# Patient Record
Sex: Female | Born: 1975 | Race: White | Hispanic: No | Marital: Married | Smoking: Never smoker
Health system: Southern US, Community
[De-identification: ages and names within clinical notes are randomized; demographics above are authoritative.]

## PROBLEM LIST (undated history)

## (undated) DIAGNOSIS — E282 Polycystic ovarian syndrome: Secondary | ICD-10-CM

## (undated) DIAGNOSIS — K589 Irritable bowel syndrome without diarrhea: Secondary | ICD-10-CM

## (undated) DIAGNOSIS — I10 Essential (primary) hypertension: Secondary | ICD-10-CM

## (undated) HISTORY — DX: Polycystic ovarian syndrome: E28.2

---

## 1997-11-19 ENCOUNTER — Emergency Department (HOSPITAL_COMMUNITY): Admission: EM | Admit: 1997-11-19 | Discharge: 1997-11-19 | Payer: Self-pay | Admitting: Emergency Medicine

## 1998-12-09 ENCOUNTER — Other Ambulatory Visit: Admission: RE | Admit: 1998-12-09 | Discharge: 1998-12-09 | Payer: Self-pay | Admitting: Internal Medicine

## 1999-12-15 ENCOUNTER — Other Ambulatory Visit: Admission: RE | Admit: 1999-12-15 | Discharge: 1999-12-15 | Payer: Self-pay | Admitting: Internal Medicine

## 2000-12-09 ENCOUNTER — Other Ambulatory Visit: Admission: RE | Admit: 2000-12-09 | Discharge: 2000-12-09 | Payer: Self-pay | Admitting: Internal Medicine

## 2001-12-30 ENCOUNTER — Other Ambulatory Visit: Admission: RE | Admit: 2001-12-30 | Discharge: 2001-12-30 | Payer: Self-pay | Admitting: Internal Medicine

## 2003-01-05 ENCOUNTER — Other Ambulatory Visit: Admission: RE | Admit: 2003-01-05 | Discharge: 2003-01-05 | Payer: Self-pay | Admitting: Internal Medicine

## 2004-01-11 ENCOUNTER — Other Ambulatory Visit: Admission: RE | Admit: 2004-01-11 | Discharge: 2004-01-11 | Payer: Self-pay | Admitting: Internal Medicine

## 2004-06-11 ENCOUNTER — Encounter: Admission: RE | Admit: 2004-06-11 | Discharge: 2004-06-11 | Payer: Self-pay | Admitting: Internal Medicine

## 2005-01-12 ENCOUNTER — Other Ambulatory Visit: Admission: RE | Admit: 2005-01-12 | Discharge: 2005-01-12 | Payer: Self-pay | Admitting: Internal Medicine

## 2007-01-13 ENCOUNTER — Inpatient Hospital Stay (HOSPITAL_COMMUNITY): Admission: AD | Admit: 2007-01-13 | Discharge: 2007-01-13 | Payer: Self-pay | Admitting: Obstetrics and Gynecology

## 2007-02-10 ENCOUNTER — Inpatient Hospital Stay (HOSPITAL_COMMUNITY): Admission: AD | Admit: 2007-02-10 | Discharge: 2007-02-14 | Payer: Self-pay | Admitting: Obstetrics and Gynecology

## 2007-02-11 ENCOUNTER — Encounter (INDEPENDENT_AMBULATORY_CARE_PROVIDER_SITE_OTHER): Payer: Self-pay | Admitting: Obstetrics and Gynecology

## 2008-03-26 ENCOUNTER — Telehealth: Payer: Self-pay | Admitting: Family Medicine

## 2008-03-26 ENCOUNTER — Other Ambulatory Visit: Admission: RE | Admit: 2008-03-26 | Discharge: 2008-03-26 | Payer: Self-pay | Admitting: Family Medicine

## 2008-03-26 ENCOUNTER — Ambulatory Visit: Payer: Self-pay | Admitting: Family Medicine

## 2008-03-26 ENCOUNTER — Encounter: Payer: Self-pay | Admitting: Family Medicine

## 2008-03-26 DIAGNOSIS — E282 Polycystic ovarian syndrome: Secondary | ICD-10-CM | POA: Insufficient documentation

## 2008-03-26 DIAGNOSIS — K589 Irritable bowel syndrome without diarrhea: Secondary | ICD-10-CM | POA: Insufficient documentation

## 2008-03-26 DIAGNOSIS — E785 Hyperlipidemia, unspecified: Secondary | ICD-10-CM | POA: Insufficient documentation

## 2008-03-26 DIAGNOSIS — G43009 Migraine without aura, not intractable, without status migrainosus: Secondary | ICD-10-CM | POA: Insufficient documentation

## 2008-03-27 ENCOUNTER — Telehealth: Payer: Self-pay | Admitting: Family Medicine

## 2008-03-27 ENCOUNTER — Encounter: Payer: Self-pay | Admitting: Family Medicine

## 2008-03-27 LAB — CONVERTED CEMR LAB
AST: 26 units/L (ref 0–37)
Alkaline Phosphatase: 53 units/L (ref 39–117)
BUN: 8 mg/dL (ref 6–23)
Glucose, Bld: 86 mg/dL (ref 70–99)
HDL: 50 mg/dL (ref >39)
LDL Cholesterol: 142 mg/dL — ABNORMAL HIGH (ref 0–99)
Potassium: 4 meq/L (ref 3.5–5.3)
Sodium: 139 meq/L (ref 135–145)
TSH: 1.81 microintl units/mL (ref 0.350–4.50)
Total Bilirubin: 0.5 mg/dL (ref 0.3–1.2)
Total Protein: 7 g/dL (ref 6.0–8.3)
Triglycerides: 286 mg/dL — ABNORMAL HIGH (ref <150)
VLDL: 57 mg/dL — ABNORMAL HIGH (ref 0–40)

## 2008-03-29 ENCOUNTER — Ambulatory Visit: Payer: Self-pay | Admitting: Family Medicine

## 2008-03-29 DIAGNOSIS — I10 Essential (primary) hypertension: Secondary | ICD-10-CM

## 2008-07-02 ENCOUNTER — Encounter: Payer: Self-pay | Admitting: Family Medicine

## 2008-07-03 LAB — CONVERTED CEMR LAB
HDL: 60 mg/dL (ref >39)
LDL Cholesterol: 119 mg/dL — ABNORMAL HIGH (ref 0–99)
Total CHOL/HDL Ratio: 4.3

## 2008-09-28 ENCOUNTER — Telehealth (INDEPENDENT_AMBULATORY_CARE_PROVIDER_SITE_OTHER): Payer: Self-pay | Admitting: *Deleted

## 2009-03-19 ENCOUNTER — Telehealth: Payer: Self-pay | Admitting: Family Medicine

## 2009-03-21 ENCOUNTER — Encounter: Payer: Self-pay | Admitting: Family Medicine

## 2009-03-25 LAB — CONVERTED CEMR LAB
ALT: 36 units/L — ABNORMAL HIGH (ref 0–35)
AST: 32 units/L (ref 0–37)
Albumin: 4.3 g/dL (ref 3.5–5.2)
Alkaline Phosphatase: 53 units/L (ref 39–117)
LDL Cholesterol: 164 mg/dL — ABNORMAL HIGH (ref 0–99)
Potassium: 4.3 meq/L (ref 3.5–5.3)
Sodium: 140 meq/L (ref 135–145)
Total Bilirubin: 0.6 mg/dL (ref 0.3–1.2)
Total Protein: 7 g/dL (ref 6.0–8.3)
Triglycerides: 333 mg/dL — ABNORMAL HIGH (ref <150)
VLDL: 67 mg/dL — ABNORMAL HIGH (ref 0–40)

## 2009-03-26 ENCOUNTER — Ambulatory Visit: Payer: Self-pay | Admitting: Family Medicine

## 2009-03-26 ENCOUNTER — Other Ambulatory Visit: Admission: RE | Admit: 2009-03-26 | Discharge: 2009-03-26 | Payer: Self-pay | Admitting: Family Medicine

## 2009-03-26 ENCOUNTER — Encounter: Payer: Self-pay | Admitting: Family Medicine

## 2009-03-28 LAB — CONVERTED CEMR LAB: Pap Smear: NORMAL

## 2009-04-25 ENCOUNTER — Ambulatory Visit: Payer: Self-pay | Admitting: Family Medicine

## 2009-05-07 ENCOUNTER — Ambulatory Visit: Payer: Self-pay | Admitting: Family Medicine

## 2009-05-08 ENCOUNTER — Encounter: Payer: Self-pay | Admitting: Family Medicine

## 2009-05-08 LAB — CONVERTED CEMR LAB
Chloride: 101 meq/L (ref 96–112)
Creatinine, Ser: 0.61 mg/dL (ref 0.40–1.20)
Potassium: 3.9 meq/L (ref 3.5–5.3)
Sodium: 137 meq/L (ref 135–145)

## 2009-06-28 ENCOUNTER — Ambulatory Visit: Payer: Self-pay | Admitting: Family Medicine

## 2009-06-28 DIAGNOSIS — J019 Acute sinusitis, unspecified: Secondary | ICD-10-CM

## 2009-11-18 ENCOUNTER — Ambulatory Visit: Payer: Self-pay | Admitting: Family Medicine

## 2010-06-24 NOTE — Assessment & Plan Note (Signed)
Summary: f/u HTN   Vital Signs:  Patient profile:   35 year old female Height:      60 inches Weight:      188 pounds BMI:     36.85 O2 Sat:      98 % on Room air Pulse rate:   90 / minute BP sitting:   129 / 79  (left arm) Cuff size:   large  Vitals Entered By: Payton Spark CMA (November 18, 2009 1:03 PM)  O2 Flow:  Room air CC: F/U HTN, Hypertension Management   CC:  F/U HTN and Hypertension Management.  Hypertension History:      She denies headache, chest pain, palpitations, dyspnea with exertion, orthopnea, PND, peripheral edema, visual symptoms, neurologic problems, syncope, and side effects from treatment.  She notes no problems with any antihypertensive medication side effects.        Positive major cardiovascular risk factors include hyperlipidemia and hypertension.  Negative major cardiovascular risk factors include female age less than 49 years old, no history of diabetes, negative family history for ischemic heart disease, and non-tobacco-user status.        Further assessment for target organ damage reveals no history of ASHD, cardiac end-organ damage (CHF/LVH), stroke/TIA, peripheral vascular disease, renal insufficiency, or hypertensive retinopathy.     Current Medications (verified): 1)  Daily Multiple Vitamins  Tabs (Multiple Vitamin) .... Tak Eone Tablet By Mouth Oncea Day 2)  Sumatriptan Succinate 100 Mg Tabs (Sumatriptan Succinate) .... One By Mouth As Needed By Migraines. 3)  Lisinopril-Hydrochlorothiazide 10-12.5 Mg Tabs (Lisinopril-Hydrochlorothiazide) .... Take 1 Tablet By Mouth Once A Day 4)  Jolessa 0.15-0.03 Mg Tabs (Levonorgest-Eth Estrad 91-Day) .... Take 1 Tablet By Mouth Once A Day 5)  Proair Hfa 108 (90 Base) Mcg/act Aers (Albuterol Sulfate) .... 2- 4 Puffs Inhaled Every 4-6 Hours As Needed 6)  Tramadol Hcl 50 Mg Tabs (Tramadol Hcl) .... Take 1 Tablet By Mouth Three Times A Day As Needed Severe Pain.  Allergies (verified): No Known Drug  Allergies   Impression & Recommendations:  Problem # 1:  HYPERTENSION, BENIGN (ICD-401.1) BP is at goal.  RFd meds.  She is to stay on OCPs while on this until she sees her OB.  Labs UTD.  CPE due in Nov. Her updated medication list for this problem includes:    Lisinopril-hydrochlorothiazide 10-12.5 Mg Tabs (Lisinopril-hydrochlorothiazide) .Marland Kitchen... Take 1 tablet by mouth once a day  BP today: 129/79 Prior BP: 124/82 (06/28/2009)  Prior 10 Yr Risk Heart Disease: 1 % (03/27/2008)  Labs Reviewed: K+: 3.9 (05/08/2009) Creat: : 0.61 (05/08/2009)   Chol: 284 (03/21/2009)   HDL: 53 (03/21/2009)   LDL: 164 (03/21/2009)   TG: 333 (03/21/2009)  Complete Medication List: 1)  Daily Multiple Vitamins Tabs (Multiple vitamin) .... Tak eone tablet by mouth oncea day 2)  Sumatriptan Succinate 100 Mg Tabs (Sumatriptan succinate) .... One by mouth as needed by migraines. 3)  Lisinopril-hydrochlorothiazide 10-12.5 Mg Tabs (Lisinopril-hydrochlorothiazide) .... Take 1 tablet by mouth once a day 4)  Jolessa 0.15-0.03 Mg Tabs (Levonorgest-eth estrad 91-day) .... Take 1 tablet by mouth once a day 5)  Proair Hfa 108 (90 Base) Mcg/act Aers (Albuterol sulfate) .... 2- 4 puffs inhaled every 4-6 hours as needed 6)  Tramadol Hcl 50 Mg Tabs (Tramadol hcl) .... Take 1 tablet by mouth three times a day as needed severe pain.  Hypertension Assessment/Plan:      The patient's hypertensive risk group is category B: At least one risk factor (  excluding diabetes) with no target organ damage.  Her calculated 10 year risk of coronary heart disease is 1 %.  Today's blood pressure is 129/79.  Her blood pressure goal is < 140/90.  Patient Instructions: 1)  Stay on current meds for now. 2)  CPE due after 11-2. 3)  Meds RFd Prescriptions: LISINOPRIL-HYDROCHLOROTHIAZIDE 10-12.5 MG TABS (LISINOPRIL-HYDROCHLOROTHIAZIDE) Take 1 tablet by mouth once a day  #90 x 1   Entered and Authorized by:   Seymour Bars DO   Signed by:   Seymour Bars DO on 11/18/2009   Method used:   Electronically to        [facility]* (retail)             ,          Ph: [phone]       Fax: [phone]   RxID:   [id number]

## 2010-06-24 NOTE — Assessment & Plan Note (Signed)
Summary: Acute sinus   Vital Signs:  Patient profile:   35 year old female Height:      60 inches Weight:      186 pounds BMI:     36.46 O2 Sat:      99 % Temp:     98.4 degrees F oral Pulse rate:   87 / minute BP sitting:   124 / 82  (right arm)  Vitals Entered By: Charolett Bumpers (June 28, 2009 1:49 PM)  Primary Care Provider:  Nani Gasser, MD   History of Present Illness: [name] afternoon started with severe HA, then pain radiates into her left jaw. No ear pain.  Nasal congestion. took sudafed - some relief.  Alsoon her period. No fever. Using icepack.  Taking excedrin for the HA. Took an imitrex last night as well. No ST. Had a URI last week, started in her chest and then went to her nose. Did get better for a few days.  Did vomit once last night.    Allergies: No Known Drug Allergies  Physical Exam  General:  Well-developed,well-nourished,in no acute distress; alert,appropriate and cooperative throughout examination Head:  Normocephalic and atraumatic without obvious abnormalities. No apparent alopecia or balding. Eyes:  No corneal or conjunctival inflammation noted. EOMI. Perrla.  Ears:  External ear exam shows no significant lesions or deformities.  Otoscopic examination reveals clear canals, tympanic membranes are intact bilaterally without bulging, retraction, inflammation or discharge. Hearing is grossly normal bilaterally. Nose:  External nasal examination shows no deformity or inflammation. Nasal mucosa are pink and moist without lesions or exudates. Mouth:  Oral mucosa and oropharynx without lesions or exudates.  Teeth in good repair. Neck:  No deformities, masses, or tenderness noted. Lungs:  Normal respiratory effort, chest expands symmetrically. Lungs are clear to auscultation, no crackles or wheezes. Heart:  Normal rate and regular rhythm. S1 and S2 normal without gallop, murmur, click, rub or other extra sounds. Skin:  no rashes.   Cervical Nodes:   No lymphadenopathy noted Psych:  Cognition and judgment appear intact. Alert and cooperative with normal attention span and concentration. No apparent delusions, illusions, hallucinations   Impression & Recommendations:  Problem # 1:  SINUSITIS, ACUTE (ICD-461.9)  Likely secondary bacterial infection after a viral infection last week.  Her updated medication list for this problem includes:    Amoxicillin 500 Mg Cap (Amoxicillin) .Marland Kitchen... Take 1 capsule by mouth three times a day x 10 days  Instructed on treatment. Call if symptoms persist or worsen.   Complete Medication List: 1)  Daily Multiple Vitamins Tabs (Multiple vitamin) .... Tak eone tablet by mouth oncea day 2)  Sumatriptan Succinate 100 Mg Tabs (Sumatriptan succinate) .... One by mouth as needed by migraines. 3)  Lisinopril-hydrochlorothiazide 10-12.5 Mg Tabs (Lisinopril-hydrochlorothiazide) .... Take 1 tablet by mouth once a day 4)  Jolessa 0.15-0.03 Mg Tabs (Levonorgest-eth estrad 91-day) .... Take 1 tablet by mouth once a day 5)  Proair Hfa 108 (90 Base) Mcg/act Aers (Albuterol sulfate) .... 2- 4 puffs inhaled every 4-6 hours as needed 6)  Amoxicillin 500 Mg Cap (Amoxicillin) .... Take 1 capsule by mouth three times a day x 10 days 7)  Tramadol Hcl 50 Mg Tabs (Tramadol hcl) .... Take 1 tablet by mouth three times a day as needed severe pain.  Prescriptions: TRAMADOL HCL 50 MG TABS (TRAMADOL HCL) Take 1 tablet by mouth three times a day as needed severe pain.  #30 x 0   Entered and Authorized by:  Nani Gasser MD   Signed by:   Nani Gasser MD on 06/28/2009   Method used:   Electronically to        [facility] [phone]* (retail)       [address] [place], [place]  [address]       Ph: [phone] or [phone]       Fax: [phone]   RxID:   [id number] AMOXICILLIN 500 MG CAP (AMOXICILLIN) Take 1 capsule by mouth three times a day X 10 days  #30 x 0   Entered and Authorized by:   Nani Gasser MD   Signed by:   Nani Gasser MD on 06/28/2009   Method used:   Electronically to        [facility] [phone]* (retail)       [address]       [place], [place]  [address]       Ph: [phone] or [phone]       Fax: [phone]   RxID:   [id number]

## 2010-07-01 LAB — ABO/RH: RH Type: POSITIVE

## 2010-07-01 LAB — ANTIBODY SCREEN: Antibody Screen: NEGATIVE

## 2010-07-01 LAB — RPR
RPR: NONREACTIVE
RPR: NONREACTIVE

## 2010-07-01 LAB — HEPATITIS B SURFACE ANTIGEN: Hepatitis B Surface Ag: NEGATIVE

## 2010-10-07 NOTE — Op Note (Signed)
NAMESANDEEP, [name] NO.:  [id number]   MEDICAL RECORD NO.:  [id number]          PATIENT TYPE:  INP   LOCATION:  9144                          FACILITY:  [facility]   PHYSICIAN:  Juluis Mire, M.D.   DATE OF BIRTH:  [birth year]   DATE OF PROCEDURE:  02/10/2007  DATE OF DISCHARGE:                               OPERATIVE REPORT   PREOPERATIVE DIAGNOSIS:  Intrauterine pregnancy at term with failure to  progress.   POSTOPERATIVE DIAGNOSIS:  Intrauterine pregnancy at term with failure to  progress.   OPERATIVE PROCEDURE:  Low transverse cesarean section.   SURGEON:  Juluis Mire, M.D.   ANESTHESIA:  Was epidural.   ESTIMATED BLOOD LOSS:  Was 500-600 mL.   PACKS AND DRAINS:  None.   INTRODUCTION:  None.   COMPLICATIONS:  None.   INDICATIONS:  As follows:  A 35 year old primigravid female presented  with spontaneous rupture of membranes.  Progressed to approximately 8 cm  distal dilatation, had a complete arrest of dilatation despite Pitocin  and adequate uterine activity over a period of 3-4 hours.  The decision  was to proceed with primary cesarean section for failure to progress.  Risks were explained including the risk of infection.  The risk of  hemorrhage that could require transfusion; risk of AIDS or hepatitis;  risk of injury to adjacent organs including bladder, bowel or ureters  that could require further exploratory surgery.  Risk of deep venous  thrombosis and pulmonary emboli.   PROCEDURE:  As follows:  The patient was taken to OR and placed in  supine position with left lateral tilt.  After satisfactory level of  epidural anesthesia was obtained, the abdomen was prepped out with  Betadine and draped as a sterile field.  A low transverse skin incision  was made with a knife and carried through subcutaneous tissue.  Fascia  was entered sharply and incision in the fascia extended laterally.  Fascia was taken off the muscle superiorly and  inferiorly.  Rectus  muscles were separated in the midline.  Peritoneum was entered sharply,  and incision in the peritoneum was extended both superiorly and  inferiorly.  Low transverse bladder flap was developed.  Low transverse  uterine incision begun with a knife and extended laterally using manual  traction.  Amniotic fluid was clear.  The infant presented in the vertex  presentation and delivered with the elevation of the head and fundal  pressure.  The infant was a viable female who weighed 7 pounds 11 ounces.  Apgars were 8/9.  Umbilical artery pH was pending at this time.  Placenta was then delivered manually and sent to pathology.  Uterus was  exteriorized for closure.  Uterus closed with running locking suture of  0 chromic using a two-layer closure technique.  Areas of continued  bleeding were brought under control with figure-of-eights of 0 chromic.  Tubes and ovaries were completely normal.  Uterus was returned the  abdominal cavity.  We irrigated the pelvis and had no active bleeding.  Urine output was initially blood tinged when she came  into the OR.  It  was clearing at this point in time.  At this point in time, muscle and  peritoneum closed with a running suture of 3-0 Vicryl.  Fascia closed  with a running  suture of 0 PDS.  Skin was closed with staples and Steri-Strips.  Sponge, instrument and needle count was reported as correct by the  circulating nurse x2.  Foley catheter again was clearing at the time of  closure.  The patient tolerated the procedure well and was returned to  the recovery room in good condition.      Juluis Mire, M.D.  Electronically Signed     JSM/MEDQ  D:  02/11/2007  T:  02/11/2007  Job:  [id number]

## 2010-10-07 NOTE — Discharge Summary (Signed)
[name], [name]                 ACCOUNT NO.:  [id number]   MEDICAL RECORD NO.:  [id number]          PATIENT TYPE:  INP   LOCATION:  9144                          FACILITY:  [facility]   PHYSICIAN:  Dineen Kid. Rana Snare, M.D.    DATE OF BIRTH:  1975-10-16   DATE OF ADMISSION:  02/10/2007  DATE OF DISCHARGE:  02/14/2007                               DISCHARGE SUMMARY   ADMITTING DIAGNOSES:  1. Intrauterine pregnancy at 37 weeks estimated gestational age.  2. Spontaneous rupture of membranes.   DISCHARGE DIAGNOSES:  1. Status post low transverse cesarean section secondary to failure to      progress.  2. Viable female infant.   PROCEDURE:  Primary low transverse cesarean section.   REASON FOR ADMISSION:  Please see written H&P.   HOSPITAL COURSE:  The patient is a 35 year old primigravida that was  admitted to [facility] at 37 weeks estimated  gestational age with spontaneous rupture of membranes.  Pregnancy had  been otherwise uncomplicated.  On admission, vital signs were stable.  Blood pressure was noted to be slightly elevated.  Deep tendon reflexes  were 3+.  PIH labs were drawn which were within normal limits.  Fetal  heart tones were reactive.  The patient did progress to 6 cm dilated,  completely effaced, with vertex at a -1 station.  She did develop a  maternal temperature.  IV antibiotics were started.  After approximately  2 hours the patient had made essentially no further change in cervix and  decision was made to proceed with a primary low transverse cesarean  section.  The patient was then transferred to the operating room where  spinal anesthesia was administered.  The patient did deliver a viable  female infant weighing 7 pounds 11 ounces with Apgars of 8 at one minute  and 9 at five minutes.  The patient tolerated the procedure well and was  taken to the recovery room in stable condition.  On postoperative day  #1, the patient was without complaint.  Vital  signs were stable.  She  was afebrile.  Abdomen was soft.  Abdominal dressing was noted to be  clean, dry and intact.  Laboratory findings revealed hemoglobin of 9.1.  On postoperative day #2, the patient was without complaint.  Vital signs  were stable.  She was afebrile.  Fundus firm and nontender.  Abdominal  dressing had been removed revealing an incision that was clean, dry and  intact.  Laboratory findings revealed hemoglobin stable of 8.6.  On  postoperative day #3 the patient was without complaint.  She denied any  dyspnea.  She did complain of a nonproductive cough.  Vital signs were  stable.  She was afebrile.  Heart rate was 90-105.  Abdomen soft.  Fundus firm and nontender.  Lungs were clear to auscultation.  Incision  was noted to be intact but there was a small amount of dampness noted at  the incisional site.  Staples were intact.  No seroma was noted or  ecchymosis.  The patient's blood type was noted to be A positive.  Discharge instructions were reviewed and the patient was later  discharged home.   CONDITION ON DISCHARGE:  Good.   DIET:  Regular as tolerated.   ACTIVITY:  No heavy lifting, no driving x2 weeks, no vaginal entry.   FOLLOWUP:  The patient is to follow up in the office in 2 days for  staple removal.  She is to call for temperature greater than 100  degrees, persistent nausea and vomiting, heavy vaginal bleeding, and/or  redness or drainage from the incisional site.   DISCHARGE MEDICATIONS:  1. Percocet 5/325 #30.  2. Motrin 600 mg every 6 hours p.r.n.  3. Tessalon Perles one p.o. every 8 hours p.r.n. cough.  4. Tandem one p.o. daily.      Julio Sicks, N.P.      Dineen Kid Rana Snare, M.D.  Electronically Signed    CC/MEDQ  D:  02/14/2007  T:  02/14/2007  Job:  [id number]

## 2011-01-24 ENCOUNTER — Inpatient Hospital Stay (HOSPITAL_COMMUNITY): Admission: AD | Admit: 2011-01-24 | Payer: 59 | Source: Ambulatory Visit | Admitting: Obstetrics and Gynecology

## 2011-01-29 ENCOUNTER — Encounter (HOSPITAL_COMMUNITY)
Admission: RE | Admit: 2011-01-29 | Discharge: 2011-01-29 | Disposition: A | Discharge status: Home / Self Care | Payer: 59 | Source: Ambulatory Visit | Attending: Obstetrics and Gynecology | Admitting: Obstetrics and Gynecology

## 2011-01-29 ENCOUNTER — Encounter (HOSPITAL_COMMUNITY): Payer: Self-pay

## 2011-01-29 HISTORY — DX: Essential (primary) hypertension: I10

## 2011-01-29 HISTORY — DX: Irritable bowel syndrome, unspecified: K58.9

## 2011-01-29 LAB — CBC
HCT: 34.6 % — ABNORMAL LOW (ref 36.0–46.0)
RBC: 3.96 MIL/uL (ref 3.87–5.11)
RDW: 15.3 % (ref 11.5–15.5)
WBC: 7.6 10*3/uL (ref 4.0–10.5)

## 2011-01-29 LAB — SURGICAL PCR SCREEN
MRSA, PCR: NEGATIVE
Staphylococcus aureus: POSITIVE — AB

## 2011-01-29 NOTE — Patient Instructions (Addendum)
20 [name]  01/29/2011   Your procedure is scheduled on:  02/02/11  Enter through the Main Entrance of [facility] at 1130 AM.  Pick up the phone at the desk and dial [phone].   Call this number if you have problems the morning of surgery: [phone]   Remember:   Do not eat food:After Midnight.  Do not drink clear liquids: 4 Hours before arrival.  Take these medicines the morning of surgery with A SIP OF WATER: Blood pressure medication only.   Do not wear jewelry, make-up or nail polish.  Do not wear lotions, powders, or perfumes. You may wear deodorant.  Do not shave 48 hours prior to surgery.  Do not bring valuables to the hospital.  Contacts, dentures or bridgework may not be worn into surgery.  Leave suitcase in the car. After surgery it may be brought to your room.  For patients admitted to the hospital, checkout time is 11:00 AM the day of discharge.   Patients discharged the day of surgery will not be allowed to drive home.  Name and phone number of your driver: NA  Special Instructions: CHG Shower Use Special Wash: 1/2 bottle night before surgery and 1/2 bottle morning of surgery.   Please read over the following fact sheets that you were given: MRSA Information and Care and Recovery After Surgery

## 2011-02-02 ENCOUNTER — Encounter (HOSPITAL_COMMUNITY): Payer: Self-pay | Admitting: Anesthesiology

## 2011-02-02 ENCOUNTER — Inpatient Hospital Stay (HOSPITAL_COMMUNITY): Payer: 59 | Admitting: Anesthesiology

## 2011-02-02 ENCOUNTER — Inpatient Hospital Stay (HOSPITAL_COMMUNITY)
Admission: RE | Admit: 2011-02-02 | Discharge: 2011-02-05 | DRG: 765 | Disposition: A | Discharge status: Home / Self Care | Payer: 59 | Source: Ambulatory Visit | Attending: Obstetrics and Gynecology | Admitting: Obstetrics and Gynecology

## 2011-02-02 ENCOUNTER — Encounter (HOSPITAL_COMMUNITY)
Admission: RE | Disposition: A | Discharge status: Home / Self Care | Payer: Self-pay | Source: Ambulatory Visit | Attending: Obstetrics and Gynecology

## 2011-02-02 ENCOUNTER — Encounter (HOSPITAL_COMMUNITY): Payer: Self-pay | Admitting: *Deleted

## 2011-02-02 DIAGNOSIS — Z98891 History of uterine scar from previous surgery: Secondary | ICD-10-CM

## 2011-02-02 DIAGNOSIS — O34219 Maternal care for unspecified type scar from previous cesarean delivery: Principal | ICD-10-CM | POA: Diagnosis present

## 2011-02-02 DIAGNOSIS — O1002 Pre-existing essential hypertension complicating childbirth: Secondary | ICD-10-CM | POA: Diagnosis present

## 2011-02-02 SURGERY — Surgical Case
Anesthesia: Choice | Wound class: Clean Contaminated

## 2011-02-02 MED ORDER — OXYTOCIN 20 UNITS IN LACTATED RINGERS INFUSION - SIMPLE
INTRAVENOUS | Status: DC | PRN
  Administered 2011-02-02: 20 [IU] via INTRAVENOUS

## 2011-02-02 MED ORDER — LACTATED RINGERS IV SOLN
INTRAVENOUS | Status: DC
  Administered 2011-02-02: 12:00:00 via INTRAVENOUS

## 2011-02-02 MED ORDER — SODIUM CHLORIDE 0.9 % IV SOLN: 1.0000 ug/kg/h | INTRAVENOUS | Status: DC | PRN

## 2011-02-02 MED ORDER — SIMETHICONE 80 MG PO CHEW
80.0000 mg | CHEWABLE_TABLET | Freq: Three times a day (TID) | ORAL | Status: DC
  Administered 2011-02-02 – 2011-02-05 (×10): 80 mg via ORAL

## 2011-02-02 MED ORDER — SODIUM CHLORIDE 0.9 % IJ SOLN
3.0000 mL | INTRAMUSCULAR | Status: DC | PRN
  Administered 2011-02-03: 3 mL via INTRAVENOUS

## 2011-02-02 MED ORDER — ONDANSETRON HCL 4 MG/2ML IJ SOLN: 4.0000 mg | Freq: Three times a day (TID) | INTRAMUSCULAR | Status: DC | PRN

## 2011-02-02 MED ORDER — MUPIROCIN 2 % EX OINT
TOPICAL_OINTMENT | CUTANEOUS | Status: AC
  Administered 2011-02-02: 1 via NASAL
  Filled 2011-02-02: qty 22

## 2011-02-02 MED ORDER — KETOROLAC TROMETHAMINE 30 MG/ML IJ SOLN: 30.0000 mg | Freq: Four times a day (QID) | INTRAMUSCULAR | Status: AC | PRN

## 2011-02-02 MED ORDER — TETANUS-DIPHTH-ACELL PERTUSSIS 5-2.5-18.5 LF-MCG/0.5 IM SUSP: 0.5000 mL | Freq: Once | INTRAMUSCULAR | Status: DC

## 2011-02-02 MED ORDER — NALBUPHINE HCL 10 MG/ML IJ SOLN: 5.0000 mg | INTRAMUSCULAR | Status: DC | PRN

## 2011-02-02 MED ORDER — LACTATED RINGERS IV SOLN
INTRAVENOUS | Status: DC
  Administered 2011-02-02 – 2011-02-03 (×2): via INTRAVENOUS

## 2011-02-02 MED ORDER — ONDANSETRON HCL 4 MG/2ML IJ SOLN
INTRAMUSCULAR | Status: DC | PRN
  Administered 2011-02-02: 4 mg via INTRAVENOUS

## 2011-02-02 MED ORDER — NALOXONE HCL 0.4 MG/ML IJ SOLN: 0.4000 mg | INTRAMUSCULAR | Status: DC | PRN

## 2011-02-02 MED ORDER — BUPIVACAINE HCL (PF) 0.25 % IJ SOLN
INTRAMUSCULAR | Status: DC | PRN
  Administered 2011-02-02: 10 mL

## 2011-02-02 MED ORDER — ONDANSETRON HCL 4 MG/2ML IJ SOLN: 4.0000 mg | INTRAMUSCULAR | Status: DC | PRN

## 2011-02-02 MED ORDER — MEPERIDINE HCL 25 MG/ML IJ SOLN: 6.2500 mg | INTRAMUSCULAR | Status: DC | PRN

## 2011-02-02 MED ORDER — OXYTOCIN 20 UNITS IN LACTATED RINGERS INFUSION - SIMPLE
125.0000 mL/h | INTRAVENOUS | Status: AC
  Administered 2011-02-02: 125 mL/h via INTRAVENOUS

## 2011-02-02 MED ORDER — MENTHOL 3 MG MT LOZG: 1.0000 | LOZENGE | OROMUCOSAL | Status: DC | PRN

## 2011-02-02 MED ORDER — PRENATAL PLUS 27-1 MG PO TABS
1.0000 | ORAL_TABLET | Freq: Every day | ORAL | Status: DC
  Administered 2011-02-03 – 2011-02-05 (×3): 1 via ORAL
  Filled 2011-02-02 (×3): qty 1

## 2011-02-02 MED ORDER — IBUPROFEN 600 MG PO TABS
600.0000 mg | ORAL_TABLET | Freq: Four times a day (QID) | ORAL | Status: DC | PRN
  Filled 2011-02-02 (×3): qty 1

## 2011-02-02 MED ORDER — METOCLOPRAMIDE HCL 5 MG/ML IJ SOLN: 10.0000 mg | Freq: Three times a day (TID) | INTRAMUSCULAR | Status: DC | PRN

## 2011-02-02 MED ORDER — HYDROMORPHONE HCL 1 MG/ML IJ SOLN
INTRAMUSCULAR | Status: AC
  Administered 2011-02-02: 0.5 mg via INTRAVENOUS
  Filled 2011-02-02: qty 1

## 2011-02-02 MED ORDER — WITCH HAZEL-GLYCERIN EX PADS
1.0000 | MEDICATED_PAD | CUTANEOUS | Status: DC | PRN
Start: 2011-02-02 — End: 2011-02-05

## 2011-02-02 MED ORDER — KETOROLAC TROMETHAMINE 60 MG/2ML IM SOLN
60.0000 mg | Freq: Once | INTRAMUSCULAR | Status: AC | PRN
  Administered 2011-02-02: 60 mg via INTRAMUSCULAR

## 2011-02-02 MED ORDER — MEASLES, MUMPS & RUBELLA VAC ~~LOC~~ INJ: 0.5000 mL | INJECTION | Freq: Once | SUBCUTANEOUS | Status: DC

## 2011-02-02 MED ORDER — SCOPOLAMINE 1 MG/3DAYS TD PT72
1.0000 | MEDICATED_PATCH | Freq: Once | TRANSDERMAL | Status: DC
  Administered 2011-02-02: 1.5 mg via TRANSDERMAL

## 2011-02-02 MED ORDER — IBUPROFEN 600 MG PO TABS
600.0000 mg | ORAL_TABLET | Freq: Four times a day (QID) | ORAL | Status: DC
  Administered 2011-02-03 – 2011-02-05 (×9): 600 mg via ORAL
  Filled 2011-02-02 (×6): qty 1

## 2011-02-02 MED ORDER — PHENYLEPHRINE HCL 10 MG/ML IJ SOLN
INTRAMUSCULAR | Status: DC | PRN
  Administered 2011-02-02 (×6): 40 ug via INTRAVENOUS
  Administered 2011-02-02: 80 ug via INTRAVENOUS

## 2011-02-02 MED ORDER — OXYTOCIN 20 UNITS IN LACTATED RINGERS INFUSION - SIMPLE
INTRAVENOUS | Status: AC
  Administered 2011-02-02: 125 mL/h via INTRAVENOUS
  Filled 2011-02-02: qty 1000

## 2011-02-02 MED ORDER — SIMETHICONE 80 MG PO CHEW: 80.0000 mg | CHEWABLE_TABLET | ORAL | Status: DC | PRN

## 2011-02-02 MED ORDER — DIPHENHYDRAMINE HCL 25 MG PO CAPS: 25.0000 mg | ORAL_CAPSULE | ORAL | Status: DC | PRN

## 2011-02-02 MED ORDER — ONDANSETRON HCL 4 MG PO TABS: 4.0000 mg | ORAL_TABLET | ORAL | Status: DC | PRN

## 2011-02-02 MED ORDER — LABETALOL HCL 100 MG PO TABS
100.0000 mg | ORAL_TABLET | Freq: Two times a day (BID) | ORAL | Status: DC
  Administered 2011-02-03 – 2011-02-04 (×3): 100 mg via ORAL
  Filled 2011-02-02 (×7): qty 1

## 2011-02-02 MED ORDER — HYDROMORPHONE HCL 1 MG/ML IJ SOLN
0.2500 mg | INTRAMUSCULAR | Status: DC | PRN
  Administered 2011-02-02: 0.5 mg via INTRAVENOUS

## 2011-02-02 MED ORDER — MEDROXYPROGESTERONE ACETATE 150 MG/ML IM SUSP: 150.0000 mg | INTRAMUSCULAR | Status: DC | PRN

## 2011-02-02 MED ORDER — DIPHENHYDRAMINE HCL 25 MG PO CAPS: 25.0000 mg | ORAL_CAPSULE | Freq: Four times a day (QID) | ORAL | Status: DC | PRN

## 2011-02-02 MED ORDER — BUPIVACAINE IN DEXTROSE 0.75-8.25 % IT SOLN
INTRATHECAL | Status: DC | PRN
  Administered 2011-02-02: 1.5 mg via INTRATHECAL

## 2011-02-02 MED ORDER — LACTATED RINGERS IV SOLN
INTRAVENOUS | Status: DC
  Administered 2011-02-02: 13:00:00 via INTRAVENOUS

## 2011-02-02 MED ORDER — ZOLPIDEM TARTRATE 5 MG PO TABS: 5.0000 mg | ORAL_TABLET | Freq: Every evening | ORAL | Status: DC | PRN

## 2011-02-02 MED ORDER — KETOROLAC TROMETHAMINE 60 MG/2ML IM SOLN
INTRAMUSCULAR | Status: AC
  Administered 2011-02-02: 60 mg via INTRAMUSCULAR
  Filled 2011-02-02: qty 2

## 2011-02-02 MED ORDER — FENTANYL CITRATE 0.05 MG/ML IJ SOLN
INTRAMUSCULAR | Status: DC | PRN
  Administered 2011-02-02: 15 ug via INTRATHECAL

## 2011-02-02 MED ORDER — BISACODYL 10 MG RE SUPP: 10.0000 mg | Freq: Every day | RECTAL | Status: DC | PRN

## 2011-02-02 MED ORDER — LANOLIN HYDROUS EX OINT: 1.0000 "application " | TOPICAL_OINTMENT | CUTANEOUS | Status: DC | PRN

## 2011-02-02 MED ORDER — LACTATED RINGERS IV SOLN
INTRAVENOUS | Status: DC | PRN
  Administered 2011-02-02 (×2): via INTRAVENOUS

## 2011-02-02 MED ORDER — OXYCODONE-ACETAMINOPHEN 5-325 MG PO TABS
1.0000 | ORAL_TABLET | ORAL | Status: DC | PRN
  Administered 2011-02-03: 1 via ORAL
  Administered 2011-02-03: 2 via ORAL
  Filled 2011-02-02: qty 1
  Filled 2011-02-02: qty 2

## 2011-02-02 MED ORDER — DIPHENHYDRAMINE HCL 50 MG/ML IJ SOLN: 25.0000 mg | INTRAMUSCULAR | Status: DC | PRN

## 2011-02-02 MED ORDER — CEFAZOLIN SODIUM 1-5 GM-% IV SOLN
1.0000 g | INTRAVENOUS | Status: AC
  Administered 2011-02-02: 1 g via INTRAVENOUS

## 2011-02-02 MED ORDER — SENNOSIDES-DOCUSATE SODIUM 8.6-50 MG PO TABS
2.0000 | ORAL_TABLET | Freq: Every day | ORAL | Status: DC
  Administered 2011-02-02 – 2011-02-03 (×2): 2 via ORAL

## 2011-02-02 MED ORDER — MORPHINE SULFATE (PF) 0.5 MG/ML IJ SOLN
INTRAMUSCULAR | Status: DC | PRN
  Administered 2011-02-02: .1 mg via INTRATHECAL

## 2011-02-02 MED ORDER — METFORMIN HCL 500 MG PO TABS
500.0000 mg | ORAL_TABLET | Freq: Two times a day (BID) | ORAL | Status: DC
  Filled 2011-02-02 (×2): qty 1

## 2011-02-02 MED ORDER — DIPHENHYDRAMINE HCL 50 MG/ML IJ SOLN: 12.5000 mg | INTRAMUSCULAR | Status: DC | PRN

## 2011-02-02 MED ORDER — KETOROLAC TROMETHAMINE 30 MG/ML IJ SOLN
30.0000 mg | Freq: Four times a day (QID) | INTRAMUSCULAR | Status: AC | PRN
  Administered 2011-02-03: 30 mg via INTRAVENOUS
  Filled 2011-02-02: qty 1

## 2011-02-02 MED ORDER — FLEET ENEMA 7-19 GM/118ML RE ENEM: 1.0000 | ENEMA | RECTAL | Status: DC | PRN

## 2011-02-02 MED ORDER — DIBUCAINE 1 % RE OINT: 1.0000 "application " | TOPICAL_OINTMENT | RECTAL | Status: DC | PRN

## 2011-02-02 SURGICAL SUPPLY — 28 items
BARRIER ADHS 3X4 INTERCEED (GAUZE/BANDAGES/DRESSINGS) IMPLANT
CHLORAPREP W/TINT 26ML (MISCELLANEOUS) ×2 IMPLANT
CLOTH BEACON ORANGE TIMEOUT ST (SAFETY) ×2 IMPLANT
CONTAINER PREFILL 10% NBF 15ML (MISCELLANEOUS) IMPLANT
DRSG COVADERM 4X14 (GAUZE/BANDAGES/DRESSINGS) ×2 IMPLANT
ELECT REM PT RETURN 9FT ADLT (ELECTROSURGICAL) ×2
ELECTRODE REM PT RTRN 9FT ADLT (ELECTROSURGICAL) ×1 IMPLANT
EXTRACTOR VACUUM M CUP 4 TUBE (SUCTIONS) IMPLANT
GLOVE BIO SURGEON STRL SZ 6.5 (GLOVE) ×4 IMPLANT
GOWN PREVENTION PLUS LG XLONG (DISPOSABLE) ×6 IMPLANT
KIT ABG SYR 3ML LUER SLIP (SYRINGE) IMPLANT
NEEDLE HYPO 22GX1.5 SAFETY (NEEDLE) ×2 IMPLANT
NEEDLE HYPO 25X5/8 SAFETYGLIDE (NEEDLE) ×2 IMPLANT
NS IRRIG 1000ML POUR BTL (IV SOLUTION) ×2 IMPLANT
PACK C SECTION WH (CUSTOM PROCEDURE TRAY) ×2 IMPLANT
SEPRAFILM MEMBRANE 5X6 (MISCELLANEOUS) IMPLANT
SLEEVE SCD COMPRESS KNEE MED (MISCELLANEOUS) IMPLANT
STAPLER VISISTAT 35W (STAPLE) IMPLANT
SUT CHROMIC 0 CTX 36 (SUTURE) ×4 IMPLANT
SUT PLAIN 0 NONE (SUTURE) IMPLANT
SUT PLAIN 2 0 XLH (SUTURE) IMPLANT
SUT VIC AB 0 CT1 27 (SUTURE) ×3
SUT VIC AB 0 CT1 27XBRD ANBCTR (SUTURE) ×3 IMPLANT
SUT VIC AB 4-0 KS 27 (SUTURE) IMPLANT
SYR CONTROL 10ML LL (SYRINGE) ×2 IMPLANT
TOWEL OR 17X24 6PK STRL BLUE (TOWEL DISPOSABLE) ×4 IMPLANT
TRAY FOLEY CATH 14FR (SET/KITS/TRAYS/PACK) ×2 IMPLANT
WATER STERILE IRR 1000ML POUR (IV SOLUTION) ×2 IMPLANT

## 2011-02-02 NOTE — Progress Notes (Signed)
H and P on chart No changes. Proceed with Repeat LTCS

## 2011-02-02 NOTE — Transfer of Care (Signed)
Immediate Anesthesia Transfer of Care Note  Patient: [name]  Procedure(s) Performed:  CESAREAN SECTION  Patient Location: PACU  Anesthesia Type: Spinal  Level of Consciousness: awake, alert  and oriented  Airway & Oxygen Therapy: Patient Spontanous Breathing  Post-op Assessment: Report given to PACU RN and Post -op Vital signs reviewed and stable  Post vital signs: Reviewed and stable  Complications: No apparent anesthesia complications

## 2011-02-02 NOTE — Anesthesia Postprocedure Evaluation (Signed)
Anesthesia Post Note  Patient: [name]  Procedure(s) Performed:  CESAREAN SECTION  Anesthesia type: Spinal  Patient location: PACU  Post pain: Pain level controlled  Post assessment: Post-op Vital signs reviewed  Last Vitals:  Filed Vitals:   02/02/11 1415  BP: 127/75  Pulse: 84  Temp:   Resp: 16    Post vital signs: Reviewed  Level of consciousness: awake  Complications: No apparent anesthesia complications

## 2011-02-02 NOTE — Anesthesia Preprocedure Evaluation (Signed)
Anesthesia Evaluation  Name, MR# and DOB Patient awake  General Assessment Comment  Reviewed: Allergy & Precautions, NPO status , Patient's Chart, lab work & pertinent test results, reviewed documented beta blocker date and time   History of Anesthesia Complications Negative for: history of anesthetic complications  Airway Mallampati: II TM Distance: >3 FB Neck ROM: full    Dental  (+) Teeth Intact   Pulmonary  clear to auscultation  breath sounds clear to auscultation none    Cardiovascular hypertension (chronic), Pt. on home beta blockers regular Normal    Neuro/Psych   Headaches (remote h/o migraines)   Negative Neurological ROS  Negative Psych ROS  GI/Hepatic/Renal negative GI ROS  negative Liver ROS  negative Renal ROS        Endo/Other  (+)   Morbid obesityPCOS  Abdominal   Musculoskeletal   Hematology negative hematology ROS (+)   Peds  Reproductive/Obstetrics (+) Pregnancy (h/o c/s x1)    Anesthesia Other Findings             Anesthesia Physical Anesthesia Plan  ASA: III  Anesthesia Plan: Spinal   Post-op Pain Management:    Induction:   Airway Management Planned:   Additional Equipment:   Intra-op Plan:   Post-operative Plan:   Informed Consent: I have reviewed the patients History and Physical, chart, labs and discussed the procedure including the risks, benefits and alternatives for the proposed anesthesia with the patient or authorized representative who has indicated his/her understanding and acceptance.   Dental Advisory Given  Plan Discussed with: CRNA and Surgeon  Anesthesia Plan Comments:         Anesthesia Quick Evaluation

## 2011-02-02 NOTE — H&P (Signed)
35 year old G2 P1 at 60 weeks EDC 02/07/2011. She presents for Repeat C Section - Previous CSection x 1. History of Chronic Hypertension on Labetolol Prenatal care uncomplicated - see [place].  AF VSS Gen Alert and oriented Lung CTAB Car RRR Abd soft gravid Thayer Ohm 7 1/2 pound Ext no edema  Imp  IUP at 39 + weeks Chronic Hypertension Previous C Section x 1  Plan Repeat LTCS Risks discussed with patient

## 2011-02-02 NOTE — Consult Note (Signed)
Called to attend repeat C/S at term to a mother with chronic hypertension on medication and on Metformin for polycystic ovary disorder.  Membranes intact at time of surgery and there are no septic risk factors reported. At delivery infant in vertex and required vacuum assistance to expose head after which infant was fully delivered manually.  No dysmorphic features. Spontaneous cries and active movement of all extremities. Given tactile stim and bulb suction.    By observation fluid was clear and infant's transition was normal over 5 minutes. Apgar scores 9/9 at one and five minutes.   Dagoberto Ligas MD [facility]

## 2011-02-02 NOTE — Brief Op Note (Signed)
02/02/2011  2:03 PM  PATIENT:  [name]  35 y.o. female  PRE-OPERATIVE DIAGNOSIS:  Previous Cesarean Section   POST-OPERATIVE DIAGNOSIS:  Previous Cesarean Section   PROCEDURE:  Procedure(s): Repeat Low Transverse CESAREAN SECTION  SURGEON:  Surgeon(s): Jeani Hawking, MD  PHYSICIAN ASSISTANT:   ASSISTANTS: none   ANESTHESIA:   spinal  OR FLUID I/O:  Total I/O In: 2500 [I.V.:2500] Out: 800 [Urine:300; Blood:500]  BLOOD ADMINISTERED:none  DRAINS: Urinary Catheter (Foley)   LOCAL MEDICATIONS USED:  MARCAINE 10 CC  SPECIMEN:  No Specimen  DISPOSITION OF SPECIMEN:  N/A  COUNTS:  YES  TOURNIQUET:  * No tourniquets in log *  DICTATION: .Other Dictation: Dictation Number [id number]  PLAN OF CARE: Admit to inpatient   PATIENT DISPOSITION:  PACU - hemodynamically stable.   Delay start of Pharmacological VTE agent (>24hrs) due to surgical blood loss or risk of bleeding:  not applicable

## 2011-02-02 NOTE — Anesthesia Procedure Notes (Signed)
Spinal Block  Patient location during procedure: OR Start time: 02/02/2011 1:25 PM Staffing Performed by: anesthesiologist  Preanesthetic Checklist Completed: patient identified, site marked, surgical consent, pre-op evaluation, timeout performed, IV checked, risks and benefits discussed and monitors and equipment checked Spinal Block Patient position: sitting Prep: site prepped and draped and DuraPrep Patient monitoring: heart rate, cardiac monitor, continuous pulse ox and blood pressure Approach: midline Location: L3-4 Injection technique: single-shot Needle Needle type: Sprotte  Needle gauge: 24 G Needle length: 9 cm Assessment Sensory level: T4

## 2011-02-03 LAB — CBC
MCH: 28.8 pg (ref 26.0–34.0)
Platelets: 122 10*3/uL — ABNORMAL LOW (ref 150–400)
RBC: 3.2 MIL/uL — ABNORMAL LOW (ref 3.87–5.11)
RDW: 15.5 % (ref 11.5–15.5)
WBC: 7.4 10*3/uL (ref 4.0–10.5)

## 2011-02-03 MED FILL — Ephedrine Sulf-NaCl PF Pref Syr 50 MG/10ML-0.9% (5 MG/ML): INTRAVENOUS | Qty: 10 | Status: AC

## 2011-02-03 NOTE — Progress Notes (Signed)
Subjective: Postpartum Day 1: Cesarean Delivery Patient reports tolerating PO and no problems voiding.    Objective: Vital signs in last 24 hours: Temp:  [97.9 F (36.6 C)-99 F (37.2 C)] 98.3 F (36.8 C) (09/11 0630) Pulse Rate:  [69-90] 84  (09/11 0630) Resp:  [14-20] 18  (09/11 0630) BP: (90-137)/(52-80) 115/73 mmHg (09/11 0630) SpO2:  [95 %-99 %] 99 % (09/11 0630) Weight:  [88.451 kg (195 lb)] 195 lb (88.451 kg) (09/10 1144)  Physical Exam:  General: alert and cooperative Lochia: appropriate Uterine Fundus: firm Abd dressing with scant drainage  noted on bandage DVT Evaluation: No evidence of DVT seen on physical exam.   Basename 02/03/11 0540  HGB 9.2*  HCT 28.0*    Assessment/Plan: Status post Cesarean section. Doing well postoperatively.  Continue current care.  Kallee Nam G 02/03/2011, 9:00 AM

## 2011-02-03 NOTE — Anesthesia Postprocedure Evaluation (Signed)
  Anesthesia Post-op Note  Patient: [name]  Procedure(s) Performed:  CESAREAN SECTION  Patient Location: PACU and Mother/Baby  Anesthesia Type: Spinal  Level of Consciousness: awake, alert  and oriented  Airway and Oxygen Therapy: Patient Spontanous Breathing  Post-op Pain: none  Post-op Assessment: Post-op Vital signs reviewed and Patient's Cardiovascular Status Stable  Post-op Vital Signs: Reviewed and stable  Complications: No apparent anesthesia complications

## 2011-02-03 NOTE — Op Note (Signed)
[name], [name] NO.:  [id number]  MEDICAL RECORD NO.:  [id number]  LOCATION:  9133                          FACILITY:  [facility]  PHYSICIAN:  Minna Dumire L. Nature Vogelsang, M.D.DATE OF BIRTH:  June 24, 1975  DATE OF PROCEDURE:  02/02/2011 DATE OF DISCHARGE:                              OPERATIVE REPORT   PREOPERATIVE DIAGNOSES: 1. Intrauterine pregnancy at 60 and plus. 2. Previous cesarean section x1.  POSTOPERATIVE DIAGNOSES: 1. Intrauterine pregnancy at 43 and plus. 2. Previous cesarean section x1.  PROCEDURE:  Repeat low transverse cesarean section.  SURGEON:  Redmond Whittley L. Markea Ruzich, MD  ANESTHESIA:  Spinal.  ESTIMATED BLOOD LOSS:  Less than 500 mL.  COMPLICATIONS:  None.  PATHOLOGY:  None.  DESCRIPTION OF PROCEDURE:  The patient was taken to the operating room where spinal was placed.  She was then prepped and draped in usual sterile fashion.  A Foley catheter had been inserted.  A low transverse incision was made, carried down to the fascia.  The fascia was scored in the midline, extended laterally.  Rectus muscles were separated in the midline.  The peritoneum was entered bluntly.  The peritoneal incision was then stretched.  The bladder blade was reinserted.  The lower uterine segment was identified and the bladder flap was created sharply and then digitally.  A bladder blade was then readjusted.  A low transverse incision was made in the uterus.  The uterus was entered using the Hemostat.  The amniotic fluid was clear.  The baby was in cephalic presentation and was delivered easily with 1 gentle pull of the vacuum and no pop-offs.  The baby was the female infant.  Apgars 9 at 1 minute and 9 at 5 minutes.  The Neonatal Team was present for the delivery and the baby was taken to the Newborn Nursery.  After the placenta was manually removed, noted to be normal intact with a 3-vessel cord.  The uterus was cleared of all clots and debris.  Antibiotics and Pitocin  had been given.  The uterine incision was closed in 2 layers using 0 chromic and a running locked stitch.  The uterus was returned to the abdomen.  Irrigation was performed.  The peritoneum and rectus muscles were reapproximated using 0 Vicryl.  The fascia was closed using 0 Vicryl and running stitch x2, starting at each corner, meeting in the midline. After irrigation of subcutaneous layer, the skin was closed with staples.  All sponge, lap, and instrument counts were correct x2.  The patient went to recovery room in stable condition.     Ziya Coonrod L. Vincente Poli, M.D.     Florestine Avers  D:  02/02/2011  T:  02/02/2011  Job:  [id number]

## 2011-02-03 NOTE — Progress Notes (Signed)
Encounter addended by: Madison Hickman on: 02/03/2011  8:42 AM<BR>     Documentation filed: Notes Section

## 2011-02-04 NOTE — Progress Notes (Signed)
Subjective: Postpartum Daytwo: Cesarean Delivery Patient reports tolerating PO.    Objective: Vital signs in last 24 hours: Temp:  [97.7 F (36.5 C)-98.4 F (36.9 C)] 98.1 F (36.7 C) (09/12 0524) Pulse Rate:  [77-87] 77  (09/12 0524) Resp:  [18] 18  (09/12 0524) BP: (99-121)/(54-80) 114/76 mmHg (09/12 0524) SpO2:  [98 %] 98 % (09/11 1419)  Physical Exam:  General: alert Lochia: appropriate Uterine Fundus: firm Incision: healing well DVT Evaluation: No evidence of DVT seen on physical exam.   Basename 02/03/11 0540  HGB 9.2*  HCT 28.0*    Assessment/Plan: Status post Cesarean section. Doing well postoperatively.  Continue current care.  Danyeal Akens S 02/04/2011, 8:59 AM

## 2011-02-05 MED ORDER — OXYCODONE-ACETAMINOPHEN 5-325 MG PO TABS: 1.0000 | ORAL_TABLET | ORAL | Status: AC | PRN

## 2011-02-05 NOTE — Discharge Summary (Signed)
Obstetric Discharge Summary Reason for Admission: cesarean section Prenatal Procedures: none Intrapartum Procedures: cesarean: low cervical, transverse Postpartum Procedures: none Complications-Operative and Postpartum: none Hemoglobin  Date Value Range Status  02/03/2011 9.2* 12.0-15.0 (g/dL) Final     HCT  Date Value Range Status  02/03/2011 28.0* 36.0-46.0 (%) Final    Discharge Diagnoses: Term Pregnancy-delivered  Discharge Information: Date: 02/05/2011 Activity: pelvic rest Diet: routine Medications: PNV, Ibuprophen and Percocet Condition: stable Instructions: refer to practice specific booklet Discharge to: home Follow-up Information    Follow up in 4 days.         Newborn Data: Live born female  Birth Weight: 8 lb 4.5 oz (3756 g) APGAR: 9, 9  Home with mother.  Dawayne Ohair 02/05/2011, 9:20 AM

## 2011-02-05 NOTE — Progress Notes (Signed)
Subjective: Postpartum Day 3: Cesarean Delivery Patient reports tolerating PO, + flatus and no problems voiding.    Objective: Vital signs in last 24 hours: Temp:  [98.2 F (36.8 C)-98.4 F (36.9 C)] 98.4 F (36.9 C) (09/13 0600) Pulse Rate:  [87-88] 87  (09/13 0600) Resp:  [18-20] 18  (09/13 0600) BP: (112-123)/(74-75) 112/74 mmHg (09/13 0600) SpO2:  [98 %] 98 % (09/12 2115)  Physical Exam:  General: alert and cooperative Lochia: appropriate Uterine Fundus: firm Incision: healing well, no significant drainage DVT Evaluation: No evidence of DVT seen on physical exam.   Basename 02/03/11 0540  HGB 9.2*  HCT 28.0*    Assessment/Plan: Status post Cesarean section. Doing well postoperatively.  Discharge home with standard precautions and return to clinic in 1 weeks.  Madelena Maturin 02/05/2011, 9:17 AM

## 2011-02-10 ENCOUNTER — Encounter (HOSPITAL_COMMUNITY): Payer: Self-pay | Admitting: Obstetrics and Gynecology

## 2011-03-02 ENCOUNTER — Encounter (HOSPITAL_COMMUNITY): Payer: Self-pay | Admitting: *Deleted

## 2011-03-05 LAB — CBC
HCT: 25.5 — ABNORMAL LOW
HCT: 33.3 — ABNORMAL LOW
HCT: 34.7 — ABNORMAL LOW
Hemoglobin: 11.6 — ABNORMAL LOW
Hemoglobin: 11.8 — ABNORMAL LOW
Hemoglobin: 9.1 — ABNORMAL LOW
MCHC: 33.8
MCHC: 34.3
MCV: 89
MCV: 89.2
MCV: 89.3
Platelets: 176
Platelets: 186
RBC: 2.97 — ABNORMAL LOW
WBC: 10.7 — ABNORMAL HIGH
WBC: 9.1
WBC: 9.7

## 2011-03-05 LAB — COMPREHENSIVE METABOLIC PANEL
ALT: 11
AST: 16
Albumin: 2.9 — ABNORMAL LOW
Calcium: 9.2
Creatinine, Ser: 0.44
Total Protein: 6.1

## 2011-03-05 LAB — RPR: RPR Ser Ql: NONREACTIVE

## 2011-03-06 LAB — KLEIHAUER-BETKE STAIN: Fetal Cells %: 0

## 2011-03-26 HISTORY — PX: ESSURE TUBAL LIGATION: SUR464

## 2011-06-25 ENCOUNTER — Other Ambulatory Visit (HOSPITAL_COMMUNITY): Payer: Self-pay | Admitting: Obstetrics and Gynecology

## 2011-06-25 DIAGNOSIS — N971 Female infertility of tubal origin: Secondary | ICD-10-CM

## 2011-07-01 ENCOUNTER — Ambulatory Visit (HOSPITAL_COMMUNITY)
Admission: RE | Admit: 2011-07-01 | Discharge: 2011-07-01 | Disposition: A | Discharge status: Home / Self Care | Payer: 59 | Source: Ambulatory Visit | Attending: Obstetrics and Gynecology | Admitting: Obstetrics and Gynecology

## 2011-07-01 DIAGNOSIS — Z3049 Encounter for surveillance of other contraceptives: Secondary | ICD-10-CM | POA: Insufficient documentation

## 2011-07-01 DIAGNOSIS — N971 Female infertility of tubal origin: Secondary | ICD-10-CM

## 2011-07-01 MED ORDER — IOHEXOL 300 MG/ML  SOLN
5.0000 mL | Freq: Once | INTRAMUSCULAR | Status: AC | PRN
  Administered 2011-07-01: 5 mL

## 2012-03-23 ENCOUNTER — Ambulatory Visit (INDEPENDENT_AMBULATORY_CARE_PROVIDER_SITE_OTHER): Payer: 59 | Admitting: Family Medicine

## 2012-03-23 ENCOUNTER — Encounter: Payer: Self-pay | Admitting: Family Medicine

## 2012-03-23 ENCOUNTER — Other Ambulatory Visit (HOSPITAL_COMMUNITY)
Admission: RE | Admit: 2012-03-23 | Discharge: 2012-03-23 | Disposition: A | Discharge status: Home / Self Care | Payer: BC Managed Care – PPO | Source: Ambulatory Visit | Attending: Family Medicine | Admitting: Family Medicine

## 2012-03-23 VITALS — BP 153/87 | HR 64 | Ht 60.0 in | Wt 213.0 lb

## 2012-03-23 DIAGNOSIS — Z01419 Encounter for gynecological examination (general) (routine) without abnormal findings: Secondary | ICD-10-CM

## 2012-03-23 DIAGNOSIS — I10 Essential (primary) hypertension: Secondary | ICD-10-CM

## 2012-03-23 DIAGNOSIS — E785 Hyperlipidemia, unspecified: Secondary | ICD-10-CM

## 2012-03-23 DIAGNOSIS — G43909 Migraine, unspecified, not intractable, without status migrainosus: Secondary | ICD-10-CM

## 2012-03-23 LAB — COMPLETE METABOLIC PANEL WITH GFR
Albumin: 4.6 g/dL (ref 3.5–5.2)
Alkaline Phosphatase: 55 U/L (ref 39–117)
BUN: 10 mg/dL (ref 6–23)
CO2: 26 mEq/L (ref 19–32)
GFR, Est African American: >89 mL/min
GFR, Est Non African American: >89 mL/min
Glucose, Bld: 89 mg/dL (ref 70–99)
Potassium: 4 mEq/L (ref 3.5–5.3)
Sodium: 138 mEq/L (ref 135–145)
Total Protein: 7.2 g/dL (ref 6.0–8.3)

## 2012-03-23 LAB — TSH: TSH: 1.052 u[IU]/mL (ref 0.350–4.500)

## 2012-03-23 LAB — LIPID PANEL
Cholesterol: 192 mg/dL (ref 0–200)
Total CHOL/HDL Ratio: 4.3 Ratio

## 2012-03-23 MED ORDER — LISINOPRIL-HYDROCHLOROTHIAZIDE 10-12.5 MG PO TABS: 1.0000 | ORAL_TABLET | Freq: Every day | ORAL | Status: DC

## 2012-03-23 MED ORDER — METFORMIN HCL 500 MG PO TABS: 500.0000 mg | ORAL_TABLET | Freq: Two times a day (BID) | ORAL | Status: DC

## 2012-03-23 MED ORDER — HYDROCODONE-ACETAMINOPHEN 5-325 MG PO TABS: 1.0000 | ORAL_TABLET | Freq: Four times a day (QID) | ORAL | Status: DC | PRN

## 2012-03-23 NOTE — Progress Notes (Signed)
Subjective:     [name] is a 36 y.o. female and is here for a comprehensive physical exam. The patient reports problems - no problems.  Hypertension-she was on labetalol during her last pregnancy for blood pressure control. She care of her which she took before that. She now has had a tubal ligation is needing to get back on medication. She's currently not taking any blood pressure pills. She denies any chest pain or shortness of breath or dizziness or palpitations.  Migraines-she only gets about 2-3 year but would like a prescription for hydrocodone. She says it works very well for her headaches. She has tried Imitrex in the past but says she feels weird on it. Causes a uncomfortable sensation in her head that she does not like. She has really never tried any other tryptans. For her more mild headache she usually uses Excedrin Migraine.  History   Social History  . Marital Status: Married    Spouse Name: N/A    Number of Children: 2  . Years of Education: N/A   Occupational History  . [employer]   Social History Main Topics  . Smoking status: Never Smoker   . Smokeless tobacco: Never Used  . Alcohol Use: No  . Drug Use: No  . Sexually Active: Yes -- Female partner(s)   Other Topics Concern  . Not on file   Social History Narrative   No regular exercise.    Health Maintenance  Topic Date Due  . Influenza Vaccine  01/24/2012  . Pap Smear  03/24/2015  . Tetanus/tdap  03/26/2018    The following portions of the patient's history were reviewed and updated as appropriate: allergies, current medications, past family history, past medical history, past social history, past surgical history and problem list.  Review of Systems A comprehensive review of systems was negative.   Objective:    BP 153/87  Pulse 64  Ht 5' (1.524 m)  Wt 213 lb (96.616 kg)  BMI 41.60 kg/m2  Breastfeeding? No General appearance: alert, cooperative, appears stated age and moderately  obese Head: Normocephalic, without obvious abnormality Eyes: conj, EOMi, PEERLA Ears: normal TM's and external ear canals both ears Nose: Nares normal. Septum midline. Mucosa normal. No drainage or sinus tenderness. Throat: lips, mucosa, and tongue normal; teeth and gums normal Neck: no adenopathy, no carotid bruit, no JVD, supple, symmetrical, trachea midline and thyroid not enlarged, symmetric, no tenderness/mass/nodules Back: symmetric, no curvature. ROM normal. No CVA tenderness. Lungs: clear to auscultation bilaterally Breasts: normal appearance, no masses or tenderness Heart: regular rate and rhythm, S1, S2 normal, no murmur, click, rub or gallop Abdomen: soft, non-tender; bowel sounds normal; no masses,  no organomegaly Pelvic: cervix normal in appearance, external genitalia normal, no adnexal masses or tenderness, no cervical motion tenderness, rectovaginal septum normal, uterus normal size, shape, and consistency and vagina normal without discharge Extremities: extremities normal, atraumatic, no cyanosis or edema Pulses: 2+ and symmetric Skin: Skin color, texture, turgor normal. No rashes or lesions Lymph nodes: Cervical, supraclavicular, and axillary nodes normal. Neurologic: Alert and oriented X 3, normal strength and tone. Normal symmetric reflexes. Normal coordination and gait    Assessment:    Healthy female exam.      Plan:     See After Visit Summary for Counseling Recommendations  Start a regular exercise program and make sure you are eating a healthy diet Try to eat 4 servings of dairy a day or take a calcium supplement (  500mg  twice a day). Your vaccines are up to date.  Had flu shot through work.    Migraine- Will try frova 2.5 since had s.e from imitrex.  We discussed that first line therapy is a tryptan and then hydrocodone can be used rarely as a back up.  I did give her samples to try an episode. If doesn't work well or not well tolerated consider zomigfor  Relpax. Can continue to use Excedrin Migraine for the more mild headaches. Certainly if her headaches increase in frequency then please let me know. At this point with 2-3 migraines per year she does not need prophylaxis.  HTN- uncontrolled. Needs to restart meds.  New rx sent. F/U in 6 weeks for BP check.   Hyperlipidemia-uncontrolled. she's off all statins because of her recent pregnancy. We will recheck her levels today as well as baseline liver enzymes. Will restart medication if needed. She used to be on Lipitor.

## 2012-03-23 NOTE — Patient Instructions (Addendum)
Start a regular exercise program and make sure you are eating a healthy diet Try to eat 4 servings of dairy a day   

## 2012-05-09 ENCOUNTER — Ambulatory Visit (INDEPENDENT_AMBULATORY_CARE_PROVIDER_SITE_OTHER): Payer: BC Managed Care – PPO | Admitting: Family Medicine

## 2012-05-09 ENCOUNTER — Encounter: Payer: Self-pay | Admitting: Family Medicine

## 2012-05-09 VITALS — BP 113/70 | HR 85 | Ht 60.0 in | Wt 208.0 lb

## 2012-05-09 DIAGNOSIS — E282 Polycystic ovarian syndrome: Secondary | ICD-10-CM

## 2012-05-09 DIAGNOSIS — E785 Hyperlipidemia, unspecified: Secondary | ICD-10-CM

## 2012-05-09 DIAGNOSIS — G43009 Migraine without aura, not intractable, without status migrainosus: Secondary | ICD-10-CM

## 2012-05-09 DIAGNOSIS — I10 Essential (primary) hypertension: Secondary | ICD-10-CM

## 2012-05-09 NOTE — Progress Notes (Signed)
  Subjective:    Patient ID: [name], female    DOB: 10/22/1975, 36 y.o.   MRN: [id number]  HPI HTN -  Pt denies chest pain, SOB, dizziness, or heart palpitations.  Taking meds as directed w/o problems.  Denies medication side effects.  Says has lost 5 lbs.  Only eating out once a week now.  Says still strugging getting the exercise. Has had some diarrhea with the metformin.    Migraine - doing well.  Says now are rare and excedrin works well.      Review of Systems     Objective:   Physical Exam  Constitutional: She is oriented to person, place, and time. She appears well-developed and well-nourished.  HENT:  Head: Normocephalic and atraumatic.  Cardiovascular: Normal rate, regular rhythm and normal heart sounds.   Pulmonary/Chest: Effort normal and breath sounds normal.  Neurological: She is alert and oriented to person, place, and time.  Skin: Skin is warm and dry.  Psychiatric: She has a normal mood and affect. Her behavior is normal.          Assessment & Plan:  HTN - Well controlled.  F/U in 4-6 months. Has lost 6 lbs and is doing great!!! Continue with exercise and diet changes.   Hyperlidiemia - Reviewed results from October.  Recheck in 1 year. Her diet changes have made a difference.   Migraines-well-controlled currently.

## 2012-06-03 ENCOUNTER — Other Ambulatory Visit: Payer: Self-pay | Admitting: Family Medicine

## 2012-08-11 ENCOUNTER — Other Ambulatory Visit: Payer: Self-pay | Admitting: Family Medicine

## 2012-09-12 ENCOUNTER — Encounter: Payer: Self-pay | Admitting: Family Medicine

## 2012-09-12 ENCOUNTER — Ambulatory Visit (INDEPENDENT_AMBULATORY_CARE_PROVIDER_SITE_OTHER): Payer: BC Managed Care – PPO | Admitting: Family Medicine

## 2012-09-12 VITALS — BP 111/64 | HR 65 | Ht 60.0 in | Wt 186.0 lb

## 2012-09-12 DIAGNOSIS — Z6836 Body mass index (BMI) 36.0-36.9, adult: Secondary | ICD-10-CM

## 2012-09-12 DIAGNOSIS — I10 Essential (primary) hypertension: Secondary | ICD-10-CM

## 2012-09-12 DIAGNOSIS — E282 Polycystic ovarian syndrome: Secondary | ICD-10-CM

## 2012-09-12 DIAGNOSIS — R748 Abnormal levels of other serum enzymes: Secondary | ICD-10-CM

## 2012-09-12 DIAGNOSIS — R7402 Elevation of levels of lactic acid dehydrogenase (LDH): Secondary | ICD-10-CM

## 2012-09-12 LAB — COMPLETE METABOLIC PANEL WITH GFR
ALT: 25 U/L (ref 0–35)
AST: 18 U/L (ref 0–37)
Albumin: 4.4 g/dL (ref 3.5–5.2)
CO2: 25 mEq/L (ref 19–32)
Calcium: 9.9 mg/dL (ref 8.4–10.5)
Chloride: 103 mEq/L (ref 96–112)
GFR, Est African American: >89 mL/min
Potassium: 4.1 mEq/L (ref 3.5–5.3)
Sodium: 138 mEq/L (ref 135–145)
Total Protein: 7.1 g/dL (ref 6.0–8.3)

## 2012-09-12 LAB — POCT GLYCOSYLATED HEMOGLOBIN (HGB A1C): Hemoglobin A1C: 5.5

## 2012-09-12 MED ORDER — METFORMIN HCL 500 MG PO TABS: 500.0000 mg | ORAL_TABLET | Freq: Two times a day (BID) | ORAL | Status: DC

## 2012-09-12 MED ORDER — LISINOPRIL 10 MG PO TABS: 10.0000 mg | ORAL_TABLET | Freq: Every day | ORAL | Status: DC

## 2012-09-12 NOTE — Progress Notes (Signed)
  Subjective:    Patient ID: [name], female    DOB: June 18, 1975, 37 y.o.   MRN: [id number]  HPI HTN -  Pt denies chest pain, SOB, dizziness, or heart palpitations.  Taking meds as directed w/o problems.  Denies medication side effects. Eating a low salt diet.     Chemistry      Component Value Date/Time   NA 138 03/23/2012 0909   K 4.0 03/23/2012 0909   CL 102 03/23/2012 0909   CO2 26 03/23/2012 0909   BUN 10 03/23/2012 0909   CREATININE 0.75 03/23/2012 0909   CREATININE 0.61 05/08/2009 0048      Component Value Date/Time   CALCIUM 9.5 03/23/2012 0909   ALKPHOS 55 03/23/2012 0909   AST 30 03/23/2012 0909   ALT 38* 03/23/2012 0909   BILITOT 0.7 03/23/2012 0909       PCOS - On metformain. Has lost 28 lbs.  Says occ upset her stomach but just backs off when that happens.    Obesity - Doing well on metofmrin and has been on weight watchers. Hasn't missed a meeting in 16 weeks.      Review of Systems     Objective:   Physical Exam  Constitutional: She is oriented to person, place, and time. She appears well-developed and well-nourished.  HENT:  Head: Normocephalic and atraumatic.  Cardiovascular: Normal rate, regular rhythm and normal heart sounds.   Pulmonary/Chest: Effort normal and breath sounds normal.  Neurological: She is alert and oriented to person, place, and time.  Skin: Skin is warm and dry.  Psychiatric: She has a normal mood and affect. Her behavior is normal.          Assessment & Plan:  HTN- Well controlled. She missed her dose today. Will d/c her htz and will continue lisinopril 10mg  daily. Congratulated her on weight loss.   PCOS- Doing well on metofrmin.  A1C is 5.5  Elevated Liver enzymes - Recheck today. She has cut out fast food. No abdominal pain.   Obesity - Doing well on metformin and weight watchers. Has lost 28 lbs. Great job!!!

## 2013-01-31 ENCOUNTER — Encounter: Payer: Self-pay | Admitting: Family Medicine

## 2013-01-31 ENCOUNTER — Ambulatory Visit (INDEPENDENT_AMBULATORY_CARE_PROVIDER_SITE_OTHER): Payer: BC Managed Care – PPO | Admitting: Family Medicine

## 2013-01-31 VITALS — BP 95/60 | HR 59 | Temp 98.0°F | Wt 173.0 lb

## 2013-01-31 DIAGNOSIS — J209 Acute bronchitis, unspecified: Secondary | ICD-10-CM

## 2013-01-31 MED ORDER — HYDROCODONE-HOMATROPINE 5-1.5 MG/5ML PO SYRP: 5.0000 mL | ORAL_SOLUTION | Freq: Every evening | ORAL | Status: DC | PRN

## 2013-01-31 NOTE — Patient Instructions (Signed)
If not better in a week then call and we will get a Chest Xray

## 2013-01-31 NOTE — Progress Notes (Signed)
  Subjective:    Patient ID: [name], female    DOB: Jul 29, 1975, 37 y.o.   MRN: [id number]  HPI Cough x 1.5 weeks, productive.  Using allegra 24 hr and dayquill.  No fever. Chest feels really heavy. No SOB.  No sinus sxs.  Cough is keeping her up at night.  No ear pain or pressure. No ST. No GI sxs.  She says she initially thought it was more allergies but it has not gotten better.   Review of Systems     Objective:   Physical Exam  Constitutional: She is oriented to person, place, and time. She appears well-developed and well-nourished.  HENT:  Head: Normocephalic and atraumatic.  Right Ear: External ear normal.  Left Ear: External ear normal.  Nose: Nose normal.  Mouth/Throat: Oropharynx is clear and moist.  TMs and canals are clear.   Eyes: Conjunctivae and EOM are normal. Pupils are equal, round, and reactive to light.  Neck: Neck supple. No thyromegaly present.  Cardiovascular: Normal rate, regular rhythm and normal heart sounds.   Pulmonary/Chest: Effort normal and breath sounds normal. She has no wheezes.  Lymphadenopathy:    She has no cervical adenopathy.  Neurological: She is alert and oriented to person, place, and time.  Skin: Skin is warm and dry.  Psychiatric: She has a normal mood and affect.          Assessment & Plan:  Acute bronchitis - explained this is likely viral and that we do not typically treat with antibiotics without other risk factors. She did have asthma as a child subsequent to give her a sample of PROAIR today to use if she notices any wheezing or chest tightness. She did have a slight wheeze on exam today but it did improve after she coughed. I hear no other worrisome sounds on exam. She's afebrile. She is oxygenating well. If she's not better in one week and we'll get chest x-ray. Please call sooner if she suddenly gets worse. Also given her prescription for cough medicine to use at bedtime.  HTN - BP is low today. Will stop the lisinopril.  Has f/u in November.

## 2013-02-06 ENCOUNTER — Telehealth: Payer: Self-pay | Admitting: *Deleted

## 2013-02-06 ENCOUNTER — Ambulatory Visit (INDEPENDENT_AMBULATORY_CARE_PROVIDER_SITE_OTHER): Payer: BC Managed Care – PPO

## 2013-02-06 DIAGNOSIS — R05 Cough: Secondary | ICD-10-CM

## 2013-02-06 NOTE — Telephone Encounter (Signed)
Order placed for CXR. Can go anytime today.

## 2013-02-06 NOTE — Telephone Encounter (Signed)
Pt states she is no better. States the Albuterol is not helping and the cough syrup helps but is hard to take when at work. She states you mentioned a CXR if no better.

## 2013-02-06 NOTE — Telephone Encounter (Signed)
LM on VM.  Meyer Cory, LPN

## 2013-02-07 ENCOUNTER — Telehealth: Payer: Self-pay | Admitting: *Deleted

## 2013-02-07 MED ORDER — AZITHROMYCIN 250 MG PO TABS: ORAL_TABLET | ORAL | Status: DC

## 2013-02-07 NOTE — Telephone Encounter (Signed)
Pt called about her CXR. I gave her the results. She stated that she is experiencing a sore throat, sniffling, cough, and drainage especially at night. I will forward to Dr. Linford Arnold for advice.[name]

## 2013-02-07 NOTE — Telephone Encounter (Signed)
Pt called and informed.[name] [name]  

## 2013-02-07 NOTE — Telephone Encounter (Signed)
Ok will send over zpack

## 2013-03-22 ENCOUNTER — Telehealth: Payer: Self-pay | Admitting: *Deleted

## 2013-03-22 DIAGNOSIS — E785 Hyperlipidemia, unspecified: Secondary | ICD-10-CM

## 2013-03-22 DIAGNOSIS — Z01419 Encounter for gynecological examination (general) (routine) without abnormal findings: Secondary | ICD-10-CM

## 2013-03-22 DIAGNOSIS — Z0000 Encounter for general adult medical examination without abnormal findings: Secondary | ICD-10-CM

## 2013-03-24 LAB — COMPLETE METABOLIC PANEL WITHOUT GFR
ALT: 14 U/L (ref 0–35)
AST: 13 U/L (ref 0–37)
Albumin: 4.5 g/dL (ref 3.5–5.2)
Alkaline Phosphatase: 42 U/L (ref 39–117)
BUN: 9 mg/dL (ref 6–23)
CO2: 26 meq/L (ref 19–32)
Calcium: 9.4 mg/dL (ref 8.4–10.5)
Chloride: 101 meq/L (ref 96–112)
Creat: 0.69 mg/dL (ref 0.50–1.10)
GFR, Est African American: >89 mL/min
GFR, Est Non African American: >89 mL/min
Glucose, Bld: 86 mg/dL (ref 70–99)
Potassium: 4.3 meq/L (ref 3.5–5.3)
Sodium: 139 meq/L (ref 135–145)
Total Bilirubin: 0.8 mg/dL (ref 0.3–1.2)
Total Protein: 6.8 g/dL (ref 6.0–8.3)

## 2013-03-24 LAB — LIPID PANEL
LDL Cholesterol: 81 mg/dL (ref 0–99)
Triglycerides: 101 mg/dL (ref <150)
VLDL: 20 mg/dL (ref 0–40)

## 2013-03-27 ENCOUNTER — Encounter: Payer: Self-pay | Admitting: Family Medicine

## 2013-03-27 ENCOUNTER — Ambulatory Visit (INDEPENDENT_AMBULATORY_CARE_PROVIDER_SITE_OTHER): Payer: BC Managed Care – PPO | Admitting: Family Medicine

## 2013-03-27 VITALS — BP 124/63 | HR 58 | Ht 59.5 in | Wt 168.0 lb

## 2013-03-27 DIAGNOSIS — Z0000 Encounter for general adult medical examination without abnormal findings: Secondary | ICD-10-CM

## 2013-03-27 MED ORDER — HYDROCODONE-ACETAMINOPHEN 5-325 MG PO TABS: 1.0000 | ORAL_TABLET | Freq: Four times a day (QID) | ORAL | Status: DC | PRN

## 2013-03-27 MED ORDER — METFORMIN HCL 500 MG PO TABS: 500.0000 mg | ORAL_TABLET | Freq: Two times a day (BID) | ORAL | Status: DC

## 2013-03-27 NOTE — Patient Instructions (Signed)
Keep up a regular exercise program and make sure you are eating a healthy diet Try to eat 4 servings of dairy a day, or if you are lactose intolerant take a calcium with vitamin D daily.  Your vaccines are up to date.   

## 2013-03-27 NOTE — Progress Notes (Signed)
  Subjective:     [name] is a 37 y.o. female and is here for a comprehensive physical exam. The patient reports no problems.  HA -would like reifll on vicocine. Uses about 10 per year if her excedrin doesn't work.   She's also asking about the test for pancreatic cancer. Her father passed away in his mid 33s from this.  History   Social History  . Marital Status: Married    Spouse Name: N/A    Number of Children: 2  . Years of Education: N/A   Occupational History  . [employer]   Social History Main Topics  . Smoking status: Never Smoker   . Smokeless tobacco: Never Used  . Alcohol Use: No  . Drug Use: No  . Sexual Activity: Yes    Partners: Male   Other Topics Concern  . Not on file   Social History Narrative   No regular exercise.          Health Maintenance  Topic Date Due  . Influenza Vaccine  12/23/2013  . Pap Smear  03/24/2015  . Tetanus/tdap  03/26/2018    The following portions of the patient's history were reviewed and updated as appropriate: allergies, current medications, past family history, past medical history, past social history, past surgical history and problem list.  Review of Systems A comprehensive review of systems was negative.   Objective:    BP 124/63  Pulse 58  Ht 4' 11.5" (1.511 m)  Wt 168 lb (76.204 kg)  BMI 33.38 kg/m2  SpO2 97% General appearance: alert, cooperative and appears stated age Head: Normocephalic, without obvious abnormality, atraumatic Eyes: conj clear, EOMi,PEERLA Ears: normal TM's and external ear canals both ears Nose: Nares normal. Septum midline. Mucosa normal. No drainage or sinus tenderness. Throat: lips, mucosa, and tongue normal; teeth and gums normal Neck: no adenopathy, no carotid bruit, no JVD, supple, symmetrical, trachea midline and thyroid not enlarged, symmetric, no tenderness/mass/nodules Back: symmetric, no curvature. ROM normal. No CVA tenderness. Lungs: clear to  auscultation bilaterally Breasts: normal appearance, no masses or tenderness Heart: regular rate and rhythm, S1, S2 normal, no murmur, click, rub or gallop Abdomen: soft, non-tender; bowel sounds normal; no masses,  no organomegaly Extremities: extremities normal, atraumatic, no cyanosis or edema Pulses: 2+ and symmetric Skin: Skin color, texture, turgor normal. No rashes or lesions Lymph nodes: Cervical, supraclavicular, and axillary nodes normal. Neurologic: Alert and oriented X 3, normal strength and tone. Normal symmetric reflexes. Normal coordination and gait    Assessment:    Healthy female exam.      Plan:     See After Visit Summary for Counseling Recommendations  Keep up a regular exercise program and make sure you are eating a healthy diet Try to eat 4 servings of dairy a day, or if you are lactose intolerant take a calcium with vitamin D daily.  Your vaccines are up to date.   HA - refilled hydrocodone, 10 tabs last her a year.  Usually uses Excedrin Migraine with a good response.  Pap smear is up-to-date.  Given a copy of her labs that were done last week. Overall they look great.  We did discuss that there is no screening test for pancreatic cancer. Certainly if she starts to his parents any significant abdominal discomfort especially in the left upper quadrant that does not seem to be consistent with her IBS and she is to come in and be evaluated promptly.

## 2013-05-02 ENCOUNTER — Encounter: Payer: Self-pay | Admitting: Emergency Medicine

## 2013-05-02 ENCOUNTER — Emergency Department (INDEPENDENT_AMBULATORY_CARE_PROVIDER_SITE_OTHER)
Admission: EM | Admit: 2013-05-02 | Discharge: 2013-05-02 | Disposition: A | Discharge status: Home / Self Care | Payer: BC Managed Care – PPO | Source: Home / Self Care | Attending: Emergency Medicine | Admitting: Emergency Medicine

## 2013-05-02 DIAGNOSIS — J069 Acute upper respiratory infection, unspecified: Secondary | ICD-10-CM

## 2013-05-02 DIAGNOSIS — J029 Acute pharyngitis, unspecified: Secondary | ICD-10-CM

## 2013-05-02 LAB — POCT RAPID STREP A (OFFICE): Rapid Strep A Screen: NEGATIVE

## 2013-05-02 MED ORDER — AZITHROMYCIN 250 MG PO TABS: ORAL_TABLET | ORAL | Status: DC

## 2013-05-02 NOTE — ED Provider Notes (Signed)
CSN: [id number]     Arrival date & time 05/02/13  1144 History   First MD Initiated Contact with Patient 05/02/13 1147     Chief Complaint  Patient presents with  . Sore Throat  . Fever   (Consider location/radiation/quality/duration/timing/severity/associated sxs/prior Treatment) HPI URI HISTORY  [name] is a 37 y.o. female who complains of onset of cold symptoms for 12 days.  Have been using over-the-counter treatment which helps minimally. She reports that her son tested positive for strep 12 days ago.    Mild chills/sweats +  Fever  +  Nasal congestion +  Discolored Post-nasal drainage No sinus pain/pressure Positive sore throat  +  cough No wheezing No chest congestion No hemoptysis No shortness of breath No pleuritic pain  No itchy/red eyes No earache  No nausea No vomiting No abdominal pain No diarrhea  No skin rashes +  Fatigue No myalgias No headache  Past Medical History  Diagnosis Date  . Hypertension   . IBS (irritable bowel syndrome)   . PCOS (polycystic ovarian syndrome)    Past Surgical History  Procedure Laterality Date  . Cesarean section  2008  . Cesarean section  02/02/2011    Procedure: CESAREAN SECTION;  Surgeon: Jeani Hawking, MD;  Location: [facility];  Service: Gynecology;  Laterality: N/A;  . Essure tubal ligation  03/26/11   Family History  Problem Relation Age of Onset  . Hypertension    . Heart attack Paternal Grandmother   . Diabetes Mother   . Hypertension Mother   . Hyperlipidemia Mother   . Cancer Father     pancreatic   History  Substance Use Topics  . Smoking status: Never Smoker   . Smokeless tobacco: Never Used  . Alcohol Use: No   OB History   Grav Para Term Preterm Abortions TAB SAB Ect Mult Living   4 3 3       3      Review of Systems  All other systems reviewed and are negative.    Allergies  Review of patient's allergies indicates no known allergies.  Home Medications   Current Outpatient Rx   Name  Route  Sig  Dispense  Refill  . azithromycin (ZITHROMAX Z-PAK) 250 MG tablet      Take 2 tablets on day one, then 1 tablet daily on days 2 through 5   1 each   0   . HYDROcodone-acetaminophen (NORCO/VICODIN) 5-325 MG per tablet   Oral   Take 1 tablet by mouth every 6 (six) hours as needed (for migraine).   10 tablet   0   . metFORMIN (GLUCOPHAGE) 500 MG tablet   Oral   Take 1 tablet (500 mg total) by mouth 2 (two) times daily with a meal.   60 tablet   5    BP 123/82  Pulse 61  Temp(Src) 98 F (36.7 C) (Oral)  Resp 16  Ht 5' (1.524 m)  Wt 169 lb (76.658 kg)  BMI 33.01 kg/m2  SpO2 99% Physical Exam  Nursing note and vitals reviewed. Constitutional: She is oriented to person, place, and time. She appears well-developed and well-nourished. No distress.  HENT:  Head: Normocephalic and atraumatic.  Right Ear: Tympanic membrane, external ear and ear canal normal.  Left Ear: Tympanic membrane, external ear and ear canal normal.  Nose: Mucosal edema and rhinorrhea present. Right sinus exhibits maxillary sinus tenderness. Left sinus exhibits maxillary sinus tenderness.  Mouth/Throat: No oral lesions. Posterior oropharyngeal erythema present. No  oropharyngeal exudate or tonsillar abscesses.  Eyes: Right eye exhibits no discharge. Left eye exhibits no discharge. No scleral icterus.  Neck: Neck supple.  Cardiovascular: Normal rate, regular rhythm and normal heart sounds.   Pulmonary/Chest: Effort normal and breath sounds normal. She has no wheezes. She has no rales.  Lymphadenopathy:    She has no cervical adenopathy.  Neurological: She is alert and oriented to person, place, and time.  Skin: Skin is warm and dry.    ED Course  Procedures (including critical care time) Labs Review Labs Reviewed  POCT RAPID STREP A (OFFICE)   Imaging Review No results found.  EKG Interpretation    Date/Time:    Ventricular Rate:    PR Interval:    QRS Duration:   QT  Interval:    QTC Calculation:   R Axis:     Text Interpretation:              MDM   1. Upper respiratory tract infection   2. Acute pharyngitis    Rapid strep test negative. She likely has mixed bacterial URI/sinusitis, as her symptoms have been prolonged for 12 days. Risk benefits alternatives discussed. She prefers antibiotic, and I agree. Z-Pak prescribed Other symptomatic care discussed. Followup with PCP if no better 7-10 days, sooner if worse or new symptoms. Precautions discussed. Red flags discussed. Questions invited and answered. Patient voiced understanding and agreement.    Lajean Manes, MD 05/02/13 [phone]

## 2013-05-02 NOTE — ED Notes (Signed)
Pt c/o sore throat x 12 days with a fever x 1 day. She reports that her son tested positive for strep 12 days ago.

## 2014-01-09 ENCOUNTER — Other Ambulatory Visit: Payer: Self-pay | Admitting: Family Medicine

## 2014-03-26 ENCOUNTER — Encounter: Payer: Self-pay | Admitting: Emergency Medicine

## 2014-03-27 ENCOUNTER — Telehealth: Payer: Self-pay | Admitting: *Deleted

## 2014-03-27 ENCOUNTER — Other Ambulatory Visit: Payer: Self-pay | Admitting: Family Medicine

## 2014-03-27 ENCOUNTER — Other Ambulatory Visit: Payer: Self-pay | Admitting: *Deleted

## 2014-03-27 DIAGNOSIS — Z0000 Encounter for general adult medical examination without abnormal findings: Secondary | ICD-10-CM

## 2014-03-27 NOTE — Telephone Encounter (Signed)
Pt called and requested that her and her husbands labs be sent .[name], [name]

## 2014-03-29 LAB — LIPID PANEL
CHOL/HDL RATIO: 3.3 ratio
CHOLESTEROL: 183 mg/dL (ref 0–200)
HDL: 56 mg/dL (ref >39)
LDL Cholesterol: 103 mg/dL — ABNORMAL HIGH (ref 0–99)
Triglycerides: 122 mg/dL (ref <150)
VLDL: 24 mg/dL (ref 0–40)

## 2014-03-29 LAB — COMPLETE METABOLIC PANEL WITH GFR
ALK PHOS: 42 U/L (ref 39–117)
ALT: 16 U/L (ref 0–35)
AST: 16 U/L (ref 0–37)
Albumin: 4.1 g/dL (ref 3.5–5.2)
BUN: 11 mg/dL (ref 6–23)
CALCIUM: 9.1 mg/dL (ref 8.4–10.5)
CHLORIDE: 102 meq/L (ref 96–112)
CO2: 26 mEq/L (ref 19–32)
CREATININE: 0.61 mg/dL (ref 0.50–1.10)
GFR, Est African American: >89 mL/min
GFR, Est Non African American: >89 mL/min
Glucose, Bld: 86 mg/dL (ref 70–99)
POTASSIUM: 4.1 meq/L (ref 3.5–5.3)
Sodium: 138 mEq/L (ref 135–145)
Total Bilirubin: 0.8 mg/dL (ref 0.2–1.2)
Total Protein: 6.8 g/dL (ref 6.0–8.3)

## 2014-03-29 LAB — TSH: TSH: 1.243 u[IU]/mL (ref 0.350–4.500)

## 2014-03-30 ENCOUNTER — Encounter: Payer: Self-pay | Admitting: Family Medicine

## 2014-03-30 ENCOUNTER — Ambulatory Visit (INDEPENDENT_AMBULATORY_CARE_PROVIDER_SITE_OTHER): Payer: BC Managed Care – PPO | Admitting: Family Medicine

## 2014-03-30 ENCOUNTER — Telehealth: Payer: Self-pay | Admitting: *Deleted

## 2014-03-30 VITALS — BP 143/94 | HR 71 | Ht 59.5 in | Wt 187.0 lb

## 2014-03-30 DIAGNOSIS — Z0000 Encounter for general adult medical examination without abnormal findings: Secondary | ICD-10-CM

## 2014-03-30 DIAGNOSIS — Z01419 Encounter for gynecological examination (general) (routine) without abnormal findings: Secondary | ICD-10-CM

## 2014-03-30 DIAGNOSIS — IMO0001 Reserved for inherently not codable concepts without codable children: Secondary | ICD-10-CM

## 2014-03-30 DIAGNOSIS — G43001 Migraine without aura, not intractable, with status migrainosus: Secondary | ICD-10-CM

## 2014-03-30 DIAGNOSIS — R635 Abnormal weight gain: Secondary | ICD-10-CM

## 2014-03-30 DIAGNOSIS — E282 Polycystic ovarian syndrome: Secondary | ICD-10-CM

## 2014-03-30 DIAGNOSIS — R030 Elevated blood-pressure reading, without diagnosis of hypertension: Secondary | ICD-10-CM

## 2014-03-30 LAB — T4, FREE: Free T4: 1.35 ng/dL (ref 0.80–1.80)

## 2014-03-30 LAB — T3, FREE: T3, Free: 3.4 pg/mL (ref 2.3–4.2)

## 2014-03-30 MED ORDER — HYDROCODONE-ACETAMINOPHEN 5-325 MG PO TABS: 1.0000 | ORAL_TABLET | Freq: Four times a day (QID) | ORAL | Status: DC | PRN

## 2014-03-30 NOTE — Progress Notes (Signed)
Subjective:     [name] is a 38 y.o. female and is here for a comprehensive physical exam. The patient reports problems - has gained weight.  She really worked hard to lose weight. She was walking 2 miles per day and was tracking calories iwht [employer] and with [employer]. periods have been fairly regular.  She actually gained 10 lbs while doing all of this.  She does have PCOS. Needs rf for hydrocodone.   History   Social History  . Marital Status: Married    Spouse Name: N/A    Number of Children: 2  . Years of Education: N/A   Occupational History  . [employer]   Social History Main Topics  . Smoking status: Never Smoker   . Smokeless tobacco: Never Used  . Alcohol Use: No  . Drug Use: No  . Sexual Activity:    Partners: Male   Other Topics Concern  . Not on file   Social History Narrative   No regular exercise.          Health Maintenance  Topic Date Due  . INFLUENZA VACCINE  12/23/2013  . PAP SMEAR  03/24/2015  . TETANUS/TDAP  03/26/2018    The following portions of the patient's history were reviewed and updated as appropriate: allergies, current medications, past family history, past medical history, past social history, past surgical history and problem list.  Review of Systems A comprehensive review of systems was negative.   Objective:    BP 143/94 mmHg  Pulse 71  Ht 4' 11.5" (1.511 m)  Wt 187 lb (84.823 kg)  BMI 37.15 kg/m2 General appearance: alert, cooperative and appears stated age Head: Normocephalic, without obvious abnormality, atraumatic Eyes: conj clear, EOMI, pEERLA Ears: normal TM's and external ear canals both ears Nose: Nares normal. Septum midline. Mucosa normal. No drainage or sinus tenderness. Throat: lips, mucosa, and tongue normal; teeth and gums normal Neck: no adenopathy, no carotid bruit, no JVD, supple, symmetrical, trachea midline and thyroid not enlarged, symmetric, no  tenderness/mass/nodules Back: symmetric, no curvature. ROM normal. No CVA tenderness. Lungs: clear to auscultation bilaterally Breasts: normal appearance, no masses or tenderness Heart: regular rate and rhythm, S1, S2 normal, no murmur, click, rub or gallop Abdomen: soft, non-tender; bowel sounds normal; no masses,  no organomegaly Extremities: extremities normal, atraumatic, no cyanosis or edema Pulses: 2+ and symmetric Skin: Skin color, texture, turgor normal. No rashes or lesions Lymph nodes: Cervical, supraclavicular, and axillary nodes normal. Neurologic: Alert and oriented X 3, normal strength and tone. Normal symmetric reflexes. Normal coordination and gait    Assessment:    Healthy female exam.      Plan:     See After Visit Summary for Counseling Recommendations   Keep up a regular exercise program and make sure you are eating a healthy diet Try to eat 4 servings of dairy a day, or if you are lactose intolerant take a calcium with vitamin D daily.  Your vaccines are up to date.  Due for pap smear in one year.   Abnormal weight gain - dicussed options. REcommend start with seeing a nutritionist and then can also dicussed weight loss medicaiton. She would like to have full thyroid testing. Will call to see if can add Free T4, and Free t3.   Elevated BP normally her blood pressures very well controlled. We'll recheck this at her follow-up in one month. Make sure eating low salt diet. She did  have sausage for breakfast this morning.  Polycystic ovarian syndrome-repeat hemoglobin A1c is 5.5 today which is fantastic. Continue with metformin. We discussed possibly increasing her dose to help with weight loss but she says she tends to get GI symptoms on higher doses.  Migraine headaches-gave her 10 tabs of hydrocodone to use her rescue for her migraines. Typically 10 tabs last her 12 months.

## 2014-03-30 NOTE — Telephone Encounter (Signed)
Spoke w/kelly asked her to add on free t3 and free t4 to [id number] .Loralee PacasBarkley, Kassondra Geil CalexicoLynetta

## 2014-03-30 NOTE — Patient Instructions (Signed)
Phentermine tablets or capsules What is this medicine? PHENTERMINE (FEN ter meen) decreases your appetite. It is used with a reduced calorie diet and exercise to help you lose weight. This medicine may be used for other purposes; ask your health care provider or pharmacist if you have questions. COMMON BRAND NAME(S): Adipex-P, Atti-Plex P, Atti-Plex P Spansule, Fastin, Pro-Fast, Tara-8 What should I tell my health care provider before I take this medicine? They need to know if you have any of these conditions: -agitation -glaucoma -heart disease -high blood pressure -history of substance abuse -lung disease called Primary Pulmonary Hypertension (PPH) -taken an MAOI like Carbex, Eldepryl, Marplan, Nardil, or Parnate in last 14 days -thyroid disease -an unusual or allergic reaction to phentermine, other medicines, foods, dyes, or preservatives -pregnant or trying to get pregnant -breast-feeding How should I use this medicine? Take this medicine by mouth with a glass of water. Follow the directions on the prescription label. This medicine is usually taken 30 minutes before or 1 to 2 hours after breakfast. Avoid taking this medicine in the evening. It may interfere with sleep. Take your doses at regular intervals. Do not take your medicine more often than directed. Talk to your pediatrician regarding the use of this medicine in children. Special care may be needed. Overdosage: If you think you have taken too much of this medicine contact a poison control center or emergency room at once. NOTE: This medicine is only for you. Do not share this medicine with others. What if I miss a dose? If you miss a dose, take it as soon as you can. If it is almost time for your next dose, take only that dose. Do not take double or extra doses. What may interact with this medicine? Do not take this medicine with any of the following medications: -duloxetine -MAOIs like Carbex, Eldepryl, Marplan, Nardil, and  Parnate -medicines for colds or breathing difficulties like pseudoephedrine or phenylephrine -procarbazine -sibutramine -SSRIs like citalopram, escitalopram, fluoxetine, fluvoxamine, paroxetine, and sertraline -stimulants like dexmethylphenidate, methylphenidate or modafinil -venlafaxine This medicine may also interact with the following medications: -medicines for diabetes This list may not describe all possible interactions. Give your health care provider a list of all the medicines, herbs, non-prescription drugs, or dietary supplements you use. Also tell them if you smoke, drink alcohol, or use illegal drugs. Some items may interact with your medicine. What should I watch for while using this medicine? Notify your physician immediately if you become short of breath while doing your normal activities. Do not take this medicine within 6 hours of bedtime. It can keep you from getting to sleep. Avoid drinks that contain caffeine and try to stick to a regular bedtime every night. This medicine was intended to be used in addition to a healthy diet and exercise. The best results are achieved this way. This medicine is only indicated for short-term use. Eventually your weight loss may level out. At that point, the drug will only help you maintain your new weight. Do not increase or in any way change your dose without consulting your doctor. You may get drowsy or dizzy. Do not drive, use machinery, or do anything that needs mental alertness until you know how this medicine affects you. Do not stand or sit up quickly, especially if you are an older patient. This reduces the risk of dizzy or fainting spells. Alcohol may increase dizziness and drowsiness. Avoid alcoholic drinks. What side effects may I notice from receiving this medicine? Side effects that  you should report to your doctor or health care professional as soon as possible: -chest pain, palpitations -depression or severe changes in  mood -increased blood pressure -irritability -nervousness or restlessness -severe dizziness -shortness of breath -problems urinating -unusual swelling of the legs -vomiting Side effects that usually do not require medical attention (report to your doctor or health care professional if they continue or are bothersome): -blurred vision or other eye problems -changes in sexual ability or desire -constipation or diarrhea -difficulty sleeping -dry mouth or unpleasant taste -headache -nausea This list may not describe all possible side effects. Call your doctor for medical advice about side effects. You may report side effects to FDA at 1-800-FDA-1088. Where should I keep my medicine? Keep out of the reach of children. This medicine can be abused. Keep your medicine in a safe place to protect it from theft. Do not share this medicine with anyone. Selling or giving away this medicine is dangerous and against the law. Store at room temperature between 20 and 25 degrees C (68 and 77 degrees F). Keep container tightly closed. Throw away any unused medicine after the expiration date. NOTE: This sheet is a summary. It may not cover all possible information. If you have questions about this medicine, talk to your doctor, pharmacist, or health care provider.  2015, [employer] Standard. (2010-06-25 11:02:44)   Liraglutide injection (Weight Management) What is this medicine? LIRAGLUTIDE (LIR a GLOO tide) is used with a reduced calorie diet and exercise to help you lose weight. This medicine may be used for other purposes; ask your health care provider or pharmacist if you have questions. COMMON BRAND NAME(S): Saxenda What should I tell my health care provider before I take this medicine? They need to know if you have any of these conditions: -endocrine tumors (MEN 2) or if someone in your family had these tumors -gallstones -high cholesterol -history of alcohol abuse problem -history of  pancreatitis -kidney disease or if you are on dialysis -liver disease -previous swelling of the tongue, face, or lips with difficulty breathing, difficulty swallowing, hoarseness, or tightening of the throat -stomach problems -suicidal thoughts, plans, or attempt; a previous suicide attempt by you or a family member -thyroid cancer or if someone in your family had thyroid cancer -an unusual or allergic reaction to liraglutide, medicines, foods, dyes, or preservatives -pregnant or trying to get pregnant -breast-feeding How should I use this medicine? This medicine is for injection under the skin of your upper leg, stomach area, or upper arm. You will be taught how to prepare and give this medicine. Use exactly as directed. Take your medicine at regular intervals. Do not take it more often than directed. It is important that you put your used needles and syringes in a special sharps container. Do not put them in a trash can. If you do not have a sharps container, call your pharmacist or healthcare provider to get one. A special MedGuide will be given to you by the pharmacist with each prescription and refill. Be sure to read this information carefully each time. Talk to your pediatrician regarding the use of this medicine in children. Special care may be needed. Overdosage: If you think you've taken too much of this medicine contact a poison control center or emergency room at once. Overdosage: If you think you have taken too much of this medicine contact a poison control center or emergency room at once. NOTE: This medicine is only for you. Do not share this medicine with others. What  if I miss a dose? If you miss a dose, take it as soon as you can. If it is almost time for your next dose, take only that dose. Do not take double or extra doses. If you miss your dose for 3 days or more, call your doctor or health care professional to talk about how to restart this medicine. What may interact with  this medicine? -acetaminophen -atorvastatin -birth control pills -digoxin -griseofulvin -lisinopril This list may not describe all possible interactions. Give your health care provider a list of all the medicines, herbs, non-prescription drugs, or dietary supplements you use. Also tell them if you smoke, drink alcohol, or use illegal drugs. Some items may interact with your medicine. What should I watch for while using this medicine? Visit your doctor or health care professional for regular checks on your progress. This medicine is intended to be used in addition to a healthy diet and appropriate exercise. The best results are achieved this way. Do not increase or in any way change your dose without consulting your doctor or health care professional. This medicine may affect blood sugar levels. If you have diabetes, check with your doctor or health care professional before you change your diet or the dose of your diabetic medicine. Patients and their families should watch out for worsening depression or thoughts of suicide. Also watch out for sudden changes in feelings such as feeling anxious, agitated, panicky, irritable, hostile, aggressive, impulsive, severely restless, overly excited and hyperactive, or not being able to sleep. If this happens, especially at the beginning of treatment or after a change in dose, call your health care professional. What side effects may I notice from receiving this medicine? Side effects that you should report to your doctor or health care professional as soon as possible: -allergic reactions like skin rash, itching or hives, swelling of the face, lips, or tongue -breathing problems -fever, chills -loss of appetite -signs and symptoms of low blood sugar such as feeling anxious, confusion, dizziness, increased hunger, unusually weak or tired, sweating, shakiness, cold, irritable, headache, blurred vision, fast heartbeat, loss of consciousness -trouble passing  urine or change in the amount of urine -unusual stomach pain or upset -vomiting Side effects that usually do not require medical attention (Report these to your doctor or health care professional if they continue or are bothersome.): -constipation -diarrhea -fatigue -headache -nausea This list may not describe all possible side effects. Call your doctor for medical advice about side effects. You may report side effects to FDA at 1-800-FDA-1088. Where should I keep my medicine? Keep out of the reach of children. Store unopened pen in a refrigerator between 2 and 8 degrees C (36 and 46 degrees F). Do not freeze or use if the medicine has been frozen. Protect from light and excessive heat. After you first use the pen, it can be stored at room temperature between 15 and 30 degrees C (59 and 86 degrees F) or in a refrigerator. Throw away your used pen after 30 days or after the expiration date, whichever comes first. Do not store your pen with the needle attached. If the needle is left on, medicine may leak from the pen. NOTE: This sheet is a summary. It may not cover all possible information. If you have questions about this medicine, talk to your doctor, pharmacist, or health care provider.  2015, [employer] Standard. (2013-07-06 12:29:49)   Bupropion; Naltrexone extended-release tablets What is this medicine? BUPROPION; NALTREXONE (byoo PROE pee on; nal TREX one)  is a combination product used to promote and maintain weight loss in obese adults or overweight adults who also have weight related medical problems. This medicine should be used with a reduced calorie diet and increased physical activity. This medicine may be used for other purposes; ask your health care provider or pharmacist if you have questions. COMMON BRAND NAME(S): CONTRAVE What should I tell my health care provider before I take this medicine? They need to know if you have any of these conditions: -an eating disorder, such  as anorexia or bulimia -diabetes -glaucoma -head injury -heart disease -high blood pressure -history of a drug or alcohol abuse problem -history of a tumor or infection of your brain or spine -history of stroke -history of irregular heartbeat -kidney disease -liver disease -mental illness such as bipolar disorder or psychosis -seizures -suicidal thoughts, plans, or attempt; a previous suicide attempt by you or a family member -an unusual or allergic reaction to bupropion, naltrexone, other medicines, foods, dyes, or preservatives breast-feeding -pregnant or trying to become pregnant How should I use this medicine? Take this medicine by mouth with a glass of water. Follow the directions on the prescription label. Take this medicine in the morning and in the evenings as directed by your healthcare professional. Bonita QuinYou can take it with or without food. Do not take with high-fat meals as this may increase your risk of seizures. Do not crush, chew, or cut these tablets. Do not take your medicine more often than directed. Do not stop taking this medicine suddenly except upon the advice of your doctor. A special MedGuide will be given to you by the pharmacist with each prescription and refill. Be sure to read this information carefully each time. Talk to your pediatrician regarding the use of this medicine in children. Special care may be needed. Overdosage: If you think you've taken too much of this medicine contact a poison control center or emergency room at once. Overdosage: If you think you have taken too much of this medicine contact a poison control center or emergency room at once. NOTE: This medicine is only for you. Do not share this medicine with others. What if I miss a dose? If you miss a dose, skip the missed dose and take your next tablet at the regular time. Do not take double or extra doses. What may interact with this medicine? Do not take this medicine with any of the following  medications: -any prescription or street opioid drug like codiene, heroin, methadone -linezolid -MAOIs like Carbex, Eldepryl, Marplan, Nardil, and Parnate -methylene blue (injected into a vein) -other medicines that contain bupropion like Zyban or Wellbutrin This medicine may also interact with the following medications: -alcohol -certain medicines for anxiety or sleep -certain medicines for blood pressure like metoprolol, propranolol -certain medicines for depression or psychotic disturbances -certain medicines for HIV or AIDS like efavirenz, lopinavir, nelfinavir, ritonavir -certain medicines for irregular heart beat like propafenone, flecainide -certain medicines for Parkinson's disease like amantadine, levodopa -certain medicines for seizures like carbamazepine, phenytoin, phenobarbital -cimetidine -clopidogrel -cyclophosphamide -disulfiram -furazolidone -isoniazid -nicotine -orphenadrine -procarbazine -steroid medicines like prednisone or cortisone -stimulant medicines for attention disorders, weight loss, or to stay awake -tamoxifen -theophylline -thioridazine -thiotepa -ticlopidine -tramadol -warfarin This list may not describe all possible interactions. Give your health care provider a list of all the medicines, herbs, non-prescription drugs, or dietary supplements you use. Also tell them if you smoke, drink alcohol, or use illegal drugs. Some items may interact with your medicine. What should  I watch for while using this medicine? This medicine is intended to be used in addition to a healthy diet and appropriate exercise. The best results are achieved this way. Do not increase or in any way change your dose without consulting your doctor or health care professional. Do not take this medicine with other prescription or over-the-counter weight loss products without consulting your doctor or health care professional. Your doctor should tell you to stop taking this medicine if  you do not lose a certain amount of weight within the first 12 weeks of treatment. Visit your doctor or health care professional for regular checkups. Your doctor may order blood tests or other tests to see how you are doing. This medicine may affect blood sugar levels. If you have diabetes, check with your doctor or health care professional before you change your diet or the dose of your diabetic medicine. Patients and their families should watch out for new or worsening depression or thoughts of suicide. Also watch out for sudden changes in feelings such as feeling anxious, agitated, panicky, irritable, hostile, aggressive, impulsive, severely restless, overly excited and hyperactive, or not being able to sleep. If this happens, especially at the beginning of treatment or after a change in dose, call your health care professional. Avoid alcoholic drinks while taking this medicine. Drinking large amounts of alcoholic beverages, using sleeping or anxiety medicines, or quickly stopping the use of these agents while taking this medicine may increase your risk for a seizure. What side effects may I notice from receiving this medicine? Side effects that you should report to your doctor or health care professional as soon as possible: -allergic reactions like skin rash, itching or hives, swelling of the face, lips, or tongue -breathing problems -changes in vision, hearing -chest pain -confusion -dark urine -depressed mood -fast or irregular heart beat -fever -hallucination, loss of contact with reality -increased blood pressure -light-colored stools -redness, blistering, peeling or loosening of the skin, including inside the mouth -right upper belly pain -seizures -suicidal thoughts or other mood changes -unusually weak or tired -vomiting -yellowing of the eyes or skin Side effects that usually do not require medical attention (Report these to your doctor or health care professional if they  continue or are bothersome.): -constipation -diarrhea -dizziness -dry mouth -headache -nausea -trouble sleeping This list may not describe all possible side effects. Call your doctor for medical advice about side effects. You may report side effects to FDA at 1-800-FDA-1088. Where should I keep my medicine? Keep out of the reach of children. Store at room temperature between 15 and 30 degrees C (59 and 86 degrees F). Throw away any unused medicine after the expiration date. NOTE: This sheet is a summary. It may not cover all possible information. If you have questions about this medicine, talk to your doctor, pharmacist, or health care provider.  2015, [employer] Standard. (2013-02-15 15:17:29)    Phentermine; Topiramate extended-release capsules What is this medicine? Phentermine; topiramate (FEN ter meen; toe PYRE a mate) is a combination of two medicines used with a reduced calorie diet and exercise to help you lose weight. This medicine is only available through certified pharmacies enrolled in a special program. Your healthcare professional will tell you where you can get your medicine. If you have additional questions, you can visit the manufacture's website at [email or web address] or contact them by phone at [phone]. This medicine may be used for other purposes; ask your health care provider or pharmacist if you have questions. COMMON BRAND  NAME(S): Qsymia What should I tell my health care provider before I take this medicine? They need to know if you have any of these conditions: -agitation -diarrhea -depression or other mental illness -diabetes -glaucoma -heart disease -high or low blood pressure -history of anorexia or other eating disorder -history of substance abuse -kidney stones or kidney disease -liver disease -lung disease like asthma, obstructive pulmonary disease, emphysema -metabolic acidosis -on a ketogenic diet -scheduled for surgery or a  procedure -suicidal thoughts, plans, or attempt; a previous suicide attempt by you or a family member -taken an MAOI like Carbex, Eldepryl, Marplan, Nardil, or Parnate in last 14 days -thyroid disease -an unusual or allergic reaction to phentermine, topiramate, other medicines, foods, dyes, or preservatives -pregnant or trying to get pregnant -breast-feeding How should I use this medicine? Take this medicine by mouth with a glass of water. Follow the directions on the prescription label. Do not crush or chew. This medicine is usually taken with or without food once per day in the morning. Avoid taking this medicine in the evening. It may interfere with sleep. Take your doses at regular intervals. Do not take your medicine more often than directed. A special MedGuide will be given to you by the pharmacist with each prescription and refill. Be sure to read this information carefully each time. Talk to your pediatrician regarding the use of this medicine in children. Special care may be needed. Overdosage: If you think you've taken too much of this medicine contact a poison control center or emergency room at once. Overdosage: If you think you have taken too much of this medicine contact a poison control center or emergency room at once. NOTE: This medicine is only for you. Do not share this medicine with others. What if I miss a dose? If you miss a dose, take it as soon as you can. If it is almost time for your next dose, take only that dose. Do not take double or extra doses. What may interact with this medicine? Do not take this medicine with any of the following medications: -MAOIs like Carbex, Eldepryl, Marplan, Nardil, and ParnateThis medicine may also interact with the following medications: -acetazolamide -amitriptyline -antihistamines for allergy, cough and cold -atropine -birth control pills -carbamazepine -certain medicines for bladder problems like oxybutynin, tolterodine -certain  medicines for depression, anxiety, or psychotic disturbances -certain medicines for Parkinson's disease like benztropine, trihexyphenidyl -certain medicines for stomach problems like dicyclomine, hyoscyamine -certain medicines for travel sickness like scopolamine -dichlorphenamide -digoxin -diltiazem -diuretics -hydrochlorothiazide -ipratropium -lithium -medicines for diabetes -medicines for pain, sleep, or muscle relaxation -methazolamide -phenytoin -pioglitazone -stimulant medicines for attention disorders, weight loss, or to stay awake -valproic acid -zonisamide This list may not describe all possible interactions. Give your health care provider a list of all the medicines, herbs, non-prescription drugs, or dietary supplements you use. Also tell them if you smoke, drink alcohol, or use illegal drugs. Some items may interact with your medicine. What should I watch for while using this medicine? Visit your doctor or health care professional for regular checks on your progress. This medicine is intended to be used in addition to a healthy diet and appropriate exercise. The best results are achieved this way. Do not increase or in any way change your dose without consulting your doctor or health care professional. Do not take this medicine within 6 hours of bedtime. It can keep you from getting to sleep. Avoid drinks that contain caffeine and try to stick to a regular bedtime  every night. Do not stop taking this medicine suddenly. This increases the risk of seizures. This medicine can decrease sweating and increase your body temperature. Watch for signs of deceased sweating or fever. Avoid extreme heat, hot baths, and saunas. Be careful about exercising, especially in hot weather. Contact your health care provider right away if you notice a fever or decrease in sweating. You should drink plenty of fluids while taking this medicine. If you have had kidney stones in the past, this will help to  reduce your chances of forming kidney stones. If you have stomach pain, with nausea or vomiting and yellowing of your eyes or skin, call your doctor immediately. You may get drowsy or dizzy. Do not drive, use machinery, or do anything that needs mental alertness until you know how this medicine affects you. Do not stand or sit up quickly, especially if you are an older patient. This reduces the risk of dizzy or fainting spells. Alcohol may increase dizziness and drowsiness. Avoid alcoholic drinks. This medicine may affect blood sugar levels. If you have diabetes, check with your doctor or health care professional before you change your diet or the dose of your diabetic medicine. Patients and their families should watch out for worsening depression or thoughts of suicide. Also watch out for sudden changes in feelings such as feeling anxious, agitated, panicky, irritable, hostile, aggressive, impulsive, severely restless, overly excited and hyperactive, or not being able to sleep. If this happens, especially at the beginning of treatment or after a change in dose, call your health care professional. If you notice blurred vision, eye pain, or other eye problems, seek medical attention at once for an eye exam. This medicine may increase the chance of developing metabolic acidosis. If left untreated, this can cause kidney stones, bone disease, or slowed growth in children. Symptoms include breathing fast, fatigue, loss of appetite, irregular heartbeat, or loss of consciousness. Call your doctor immediately if you experience any of these side effects. Also, tell your doctor about any surgery you plan on having while taking this medicine since this may increase your risk for metabolic acidosis. Women who become pregnant while using this medicine should contact their physician immediately. You should also contact The [employer] Pregnancy Surveillance Program which is a program that monitors pregnancies that occur during  treatment. Contact the program by calling [phone]. What side effects may I notice from receiving this medicine? Side effects that you should report to your doctor or health care professional as soon as possible: -allergic reactions like skin rash, itching or hives, swelling of the face, lips, or tongue -blood in the urine -changes in vision -chest pain or chest tightness -confusion -depressed mood -difficulty breathing -dizziness -fast or irregular heartbeat -feeling anxious -irritable -loss of appetite -low blood pressure -pain in the lower back or side -pain, tingling, numbness in the hands or feet -pain when urinating -palpitations -redness, blistering, peeling or loosening of the skin, including inside the mouth -shortness of breath -suicidal thoughts or other mood changes -trouble passing urine or change in the amount of urine -trouble walking, dizziness, loss of balance or coordination -unusually weak or tired -vomiting Side effects that usually do not require medical attention (Report these to your doctor or health care professional if they continue or are bothersome.): -change in sex drive or performance -changes in vision -constipation -diarrhea -dry mouth -headache -nausea -tremors -trouble sleeping -upset stomach This list may not describe all possible side effects. Call your doctor for medical advice about side  effects. You may report side effects to FDA at 1-800-FDA-1088. Where should I keep my medicine? Keep out of the reach of children. This medicine can be abused. Keep your medicine in a safe place to protect it from theft. Do not share this medicine with anyone. Selling or giving away this medicine is dangerous and against the law. Store at room temperature between 15 and 25 degrees C (59 and 77 degrees F). Throw away any unused medicine after the expiration date. NOTE: This sheet is a summary. It may not cover all possible information. If you have  questions about this medicine, talk to your doctor, pharmacist, or health care provider.  2015, Elsevier/Gold Standard. (2010-12-15 13:56:53)    Lorcaserin oral tablets What is this medicine? LORCASERIN (lor ca SER in) is used to promote and maintain weight loss in obese patients. This medicine should be used with a reduced calorie diet and, if appropriate, an exercise program. This medicine may be used for other purposes; ask your health care provider or pharmacist if you have questions. COMMON BRAND NAME(S): Belviq What should I tell my health care provider before I take this medicine? They need to know if you have any of these conditions: -anatomical deformation of the penis, Peyronie's disease, or history of priapism (painful and prolonged erection) -diabetes -heart disease -history of blood diseases, like sickle cell anemia or leukemia -history of irregular heartbeat -kidney disease -liver disease -suicidal thoughts, plans, or attempt; a previous suicide attempt by you or a family member -an unusual or allergic reaction to lorcaserin, other medicines, foods, dyes, or preservatives -pregnant or trying to get pregnant -breast-feeding How should I use this medicine? Take this medicine by mouth with a glass of water. Follow the directions on the prescription label. You can take it with or without food. Take your medicine at regular intervals. Do not take it more often than directed. Do not stop taking except on your doctor's advice. Talk to your pediatrician regarding the use of this medicine in children. Special care may be needed. Overdosage: If you think you've taken too much of this medicine contact a poison control center or emergency room at once. Overdosage: If you think you have taken too much of this medicine contact a poison control center or emergency room at once. NOTE: This medicine is only for you. Do not share this medicine with others. What if I miss a dose? If you  miss a dose, take it as soon as you can. If it is almost time for your next dose, take only that dose. Do not take double or extra doses. What may interact with this medicine? -cabergoline -certain medicines for depression, anxiety, or psychotic disturbances -certain medicines for erectile dysfunction -certain medicines for migraine headache like almotriptan, eletriptan, frovatriptan, naratriptan, rizatriptan, sumatriptan, zolmitriptan -dextromethorphan -linezolid -MAOIs like Carbex, Eldepryl, Marplan, Nardil, and Parnate -medicines for diabetes -orlistat -tramadol -St. John's Wort -stimulant medicines for attention disorders, weight loss, or to stay awake This list may not describe all possible interactions. Give your health care provider a list of all the medicines, herbs, non-prescription drugs, or dietary supplements you use. Also tell them if you smoke, drink alcohol, or use illegal drugs. Some items may interact with your medicine. What should I watch for while using this medicine? This medicine is intended to be used in addition to a healthy diet and appropriate exercise. The best results are achieved this way. Do not increase or in any way change your dose without consulting your doctor  or health care professional. Your doctor should tell you to stop taking this medicine if you do not lose a certain amount of weight within the first 12 weeks of treatment. Visit your doctor or health care professional for regular checkups. Your doctor may order blood tests or other tests to see how you are doing. Do not drive, use machinery, or do anything that needs mental alertness until you know how this medicine affects you. This medicine may affect blood sugar levels. If you have diabetes, check with your doctor or health care professional before you change your diet or the dose of your diabetic medicine. Patients and their families should watch out for worsening depression or thoughts of suicide.  Also watch out for sudden changes in feelings such as feeling anxious, agitated, panicky, irritable, hostile, aggressive, impulsive, severely restless, overly excited and hyperactive, or not being able to sleep. If this happens, especially at the beginning of treatment or after a change in dose, call your health care professional. Contact you doctor or health care professional right away if the erection lasts longer than 4 hours or if it becomes painful. This may be a sign of serious problem and must be treated right away to prevent permanent damage. What side effects may I notice from receiving this medicine? Side effects that you should report to your doctor or health care professional as soon as possible: -allergic reactions like skin rash, itching or hives, swelling of the face, lips, or tongue -abnormal production of milk -breast enlargement in both males and females -breathing problems -changes in emotions or moods -changes in vision -confusion -erection lasting more than 4 hours -fast or irregular heart beat -feeling faint or lightheaded, falls -fever or chills, sore throat -hallucination, loss of contact with reality -high or low blood pressure -menstrual changes -restlessness -seizures -slow or irregular heartbeat -stiff muscles -sweating -suicidal thoughts or other mood changes -tremors -trouble walking -unusually weak or tired -vomiting Side effects that usually do not require medical attention (Report these to your doctor or health care professional if they continue or are bothersome.): -back pain -constipation -cough -dizziness -dry mouth -low blood sugar if you have diabetes (ask your doctor or healthcare professional for a list of these symptoms) -nausea -tiredness This list may not describe all possible side effects. Call your doctor for medical advice about side effects. You may report side effects to FDA at 1-800-FDA-1088. Where should I keep my medicine? Keep  out of the reach of children. Store at room temperature between 15 and 30 degrees C (59 and 86 degrees F). Throw away any unused medicine after the expiration date. NOTE: This sheet is a summary. It may not cover all possible information. If you have questions about this medicine, talk to your doctor, pharmacist, or health care provider.  2015, Elsevier/Gold Standard. (2010-11-25 17:15:17)

## 2014-09-04 ENCOUNTER — Other Ambulatory Visit: Payer: Self-pay | Admitting: Family Medicine

## 2015-04-01 ENCOUNTER — Encounter: Payer: Self-pay | Admitting: Family Medicine

## 2015-04-01 ENCOUNTER — Other Ambulatory Visit (HOSPITAL_COMMUNITY)
Admission: RE | Admit: 2015-04-01 | Discharge: 2015-04-01 | Disposition: A | Discharge status: Home / Self Care | Payer: BC Managed Care – PPO | Source: Ambulatory Visit | Attending: Family Medicine | Admitting: Family Medicine

## 2015-04-01 ENCOUNTER — Ambulatory Visit (INDEPENDENT_AMBULATORY_CARE_PROVIDER_SITE_OTHER): Payer: BC Managed Care – PPO | Admitting: Family Medicine

## 2015-04-01 VITALS — BP 148/88 | HR 76 | Ht 60.0 in | Wt 213.4 lb

## 2015-04-01 DIAGNOSIS — Z6841 Body Mass Index (BMI) 40.0 and over, adult: Secondary | ICD-10-CM

## 2015-04-01 DIAGNOSIS — Z01419 Encounter for gynecological examination (general) (routine) without abnormal findings: Secondary | ICD-10-CM | POA: Diagnosis present

## 2015-04-01 DIAGNOSIS — E282 Polycystic ovarian syndrome: Secondary | ICD-10-CM | POA: Diagnosis not present

## 2015-04-01 DIAGNOSIS — Z0000 Encounter for general adult medical examination without abnormal findings: Secondary | ICD-10-CM

## 2015-04-01 DIAGNOSIS — Z1151 Encounter for screening for human papillomavirus (HPV): Secondary | ICD-10-CM | POA: Diagnosis present

## 2015-04-01 DIAGNOSIS — E785 Hyperlipidemia, unspecified: Secondary | ICD-10-CM | POA: Diagnosis not present

## 2015-04-01 DIAGNOSIS — R635 Abnormal weight gain: Secondary | ICD-10-CM | POA: Diagnosis not present

## 2015-04-01 DIAGNOSIS — I10 Essential (primary) hypertension: Secondary | ICD-10-CM

## 2015-04-01 LAB — COMPLETE METABOLIC PANEL WITH GFR
ALK PHOS: 59 U/L (ref 33–115)
ALT: 20 U/L (ref 6–29)
AST: 18 U/L (ref 10–30)
Albumin: 4.1 g/dL (ref 3.6–5.1)
BUN: 9 mg/dL (ref 7–25)
CALCIUM: 9 mg/dL (ref 8.6–10.2)
CHLORIDE: 99 mmol/L (ref 98–110)
CO2: 23 mmol/L (ref 20–31)
Creat: 0.54 mg/dL (ref 0.50–1.10)
Glucose, Bld: 81 mg/dL (ref 65–99)
Potassium: 4.1 mmol/L (ref 3.5–5.3)
Sodium: 137 mmol/L (ref 135–146)
Total Bilirubin: 0.6 mg/dL (ref 0.2–1.2)
Total Protein: 6.7 g/dL (ref 6.1–8.1)

## 2015-04-01 LAB — TSH: TSH: 1.345 u[IU]/mL (ref 0.350–4.500)

## 2015-04-01 LAB — LIPID PANEL
CHOL/HDL RATIO: 3.7 ratio (ref <=5.0)
Cholesterol: 199 mg/dL (ref 125–200)
HDL: 54 mg/dL (ref >=46)
LDL Cholesterol: 117 mg/dL (ref <130)
TRIGLYCERIDES: 141 mg/dL (ref <150)
VLDL: 28 mg/dL (ref <30)

## 2015-04-01 MED ORDER — SPIRONOLACTONE 25 MG PO TABS: 25.0000 mg | ORAL_TABLET | Freq: Every day | ORAL | Status: DC

## 2015-04-01 MED ORDER — METFORMIN HCL ER 500 MG PO TB24: 500.0000 mg | ORAL_TABLET | Freq: Every day | ORAL | Status: DC

## 2015-04-01 MED ORDER — HYDROCODONE-ACETAMINOPHEN 5-325 MG PO TABS: 1.0000 | ORAL_TABLET | Freq: Four times a day (QID) | ORAL | Status: DC | PRN

## 2015-04-01 NOTE — Progress Notes (Signed)
  Subjective:     [name] is a 39 y.o. female and is here for a comprehensive physical exam. The patient reports no problems. She has gained a lot of weight back. She stopped her metoformin bc of GI upset. Even decreased down to one and had side effects..  Social History   Social History  . Marital Status: Married    Spouse Name: N/A  . Number of Children: 2  . Years of Education: N/A   Occupational History  . [employer]   Social History Main Topics  . Smoking status: Never Smoker   . Smokeless tobacco: Never Used  . Alcohol Use: No  . Drug Use: No  . Sexual Activity:    Partners: Male   Other Topics Concern  . Not on file   Social History Narrative   No regular exercise.          Health Maintenance  Topic Date Due  . INFLUENZA VACCINE  12/24/2014  . PAP SMEAR  03/24/2015  . TETANUS/TDAP  03/26/2018  . HIV Screening  Completed    The following portions of the patient's history were reviewed and updated as appropriate: allergies, current medications, past family history, past medical history, past social history, past surgical history and problem list.  Review of Systems Pertinent items noted in HPI and remainder of comprehensive ROS otherwise negative.   Objective:    There were no vitals taken for this visit. General appearance: alert, cooperative and appears stated age Head: Normocephalic, without obvious abnormality, atraumatic Eyes: conj clear, EOMI, PEERLA Ears: normal TM's and external ear canals both ears Nose: Nares normal. Septum midline. Mucosa normal. No drainage or sinus tenderness. Throat: lips, mucosa, and tongue normal; teeth and gums normal Neck: no adenopathy, no carotid bruit, no JVD, supple, symmetrical, trachea midline and thyroid not enlarged, symmetric, no tenderness/mass/nodules Back: symmetric, no curvature. ROM normal. No CVA tenderness. Lungs: clear to auscultation bilaterally Breasts: normal appearance, no masses  or tenderness Heart: regular rate and rhythm, S1, S2 normal, no murmur, click, rub or gallop Abdomen: soft, non-tender; bowel sounds normal; no masses,  no organomegaly Pelvic: cervix normal in appearance, external genitalia normal, no adnexal masses or tenderness, no cervical motion tenderness, rectovaginal septum normal, uterus normal size, shape, and consistency and vagina normal without discharge Extremities: extremities normal, atraumatic, no cyanosis or edema Pulses: 2+ and symmetric Skin: Skin color, texture, turgor normal. No rashes or lesions Lymph nodes: Cervical, supraclavicular, and axillary nodes normal. Neurologic: Alert and oriented X 3, normal strength and tone. Normal symmetric reflexes. Normal coordination and gait    Assessment:    Healthy female exam.      Plan:     See After Visit Summary for Counseling Recommendations  Keep up a regular exercise program and make sure you are eating a healthy diet Try to eat 4 servings of dairy a day, or if you are lactose intolerant take a calcium with vitamin D daily.  Your vaccines are up to date.   HTN - still elevated. Will add sprinolactone for the PCOS and that should lower the BP.    Abnormal weight gain - we discussed strategies to get back on track with diet and exercise.    PCOS - will change to metfomin ER and add spironolactone.  Gets face waxed every 3 weeks.

## 2015-04-02 LAB — CYTOLOGY - PAP

## 2015-04-04 NOTE — Progress Notes (Signed)
Quick Note:  Call patient: Your Pap smear is normal. Repeat in 5 years. ______ 

## 2015-04-16 ENCOUNTER — Encounter: Payer: Self-pay | Admitting: *Deleted

## 2015-04-16 ENCOUNTER — Emergency Department (INDEPENDENT_AMBULATORY_CARE_PROVIDER_SITE_OTHER)
Admission: EM | Admit: 2015-04-16 | Discharge: 2015-04-16 | Disposition: A | Discharge status: Home / Self Care | Payer: BC Managed Care – PPO | Source: Home / Self Care | Attending: Family Medicine | Admitting: Family Medicine

## 2015-04-16 DIAGNOSIS — R05 Cough: Secondary | ICD-10-CM | POA: Diagnosis not present

## 2015-04-16 DIAGNOSIS — J029 Acute pharyngitis, unspecified: Secondary | ICD-10-CM

## 2015-04-16 DIAGNOSIS — R059 Cough, unspecified: Secondary | ICD-10-CM

## 2015-04-16 LAB — POCT RAPID STREP A (OFFICE): Rapid Strep A Screen: NEGATIVE

## 2015-04-16 MED ORDER — BENZONATATE 100 MG PO CAPS: 100.0000 mg | ORAL_CAPSULE | Freq: Three times a day (TID) | ORAL | Status: DC

## 2015-04-16 NOTE — ED Provider Notes (Signed)
CSN: [id number]     Arrival date & time 04/16/15  1712 History   First MD Initiated Contact with Patient 04/16/15 1725     Chief Complaint  Patient presents with  . Sore Throat   (Consider location/radiation/quality/duration/timing/severity/associated sxs/prior Treatment) HPI Pt is a 39yo female presenting to [facility] wit hc/o gradually worsening sore throat that is worse on Left side, started yesterday, associated post-nasal drip and a cough.  Pt states pain was mild yesterday but woke her from her sleep around 4AM this morning and felt very dry.  Pain is mild to moderate in severity, worse with swallowing. Pt reports hx of strep throat in the past and is concerned she may have that. Pt notes she is going over to see family this week for Thanksgiving and does not want to get the kids sick if she has strep throat. Reports mild intermittent cough. Denies fever, chills, n/v/d.  Pt noted to have elevated in triage, 171/119. Pt denies chest pain, SOB, headache or dizziness. Pt states she knows about her elevated BP as her PCP is monitoring it.   Past Medical History  Diagnosis Date  . Hypertension   . IBS (irritable bowel syndrome)   . PCOS (polycystic ovarian syndrome)    Past Surgical History  Procedure Laterality Date  . Cesarean section  2008  . Cesarean section  02/02/2011    Procedure: CESAREAN SECTION;  Surgeon: Jeani Hawking, MD;  Location: [facility];  Service: Gynecology;  Laterality: N/A;  . Essure tubal ligation  03/26/11   Family History  Problem Relation Age of Onset  . Hypertension    . Heart attack Paternal Grandmother   . Diabetes Mother   . Hypertension Mother   . Hyperlipidemia Mother   . Cancer Father     pancreatic   Social History  Substance Use Topics  . Smoking status: Never Smoker   . Smokeless tobacco: Never Used  . Alcohol Use: No   OB History    Gravida Para Term Preterm AB TAB SAB Ectopic Multiple Living   Review of Systems   Constitutional: Negative for fever, chills and fatigue.  HENT: Positive for sore throat. Negative for congestion, ear pain, postnasal drip, rhinorrhea, sinus pressure, trouble swallowing and voice change.   Respiratory: Positive for cough. Negative for shortness of breath.   Gastrointestinal: Negative for nausea, vomiting, abdominal pain and diarrhea.  Neurological: Negative for dizziness, light-headedness and headaches.    Allergies  Review of patient's allergies indicates no known allergies.  Home Medications   Prior to Admission medications   Medication Sig Start Date End Date Taking? Authorizing Provider  benzonatate (TESSALON) 100 MG capsule Take 1 capsule (100 mg total) by mouth every 8 (eight) hours. 04/16/15   Junius Finner, PA-C  metFORMIN (GLUCOPHAGE-XR) 500 MG 24 hr tablet Take 1 tablet (500 mg total) by mouth daily with breakfast. 04/01/15   Agapito Games, MD  spironolactone (ALDACTONE) 25 MG tablet Take 1 tablet (25 mg total) by mouth daily. 04/01/15   Agapito Games, MD   Meds Ordered and Administered this Visit  Medications - No data to display  BP 171/119 mmHg  Pulse 90  Temp(Src) 98.1 F (36.7 C) (Oral)  Resp 18  Ht 5' (1.524 m)  Wt 210 lb (95.255 kg)  BMI 41.01 kg/m2  SpO2 100% No data found.   Physical Exam  Constitutional: She appears well-developed and well-nourished.  No distress.  HENT:  Head: Normocephalic and atraumatic.  Right Ear: Hearing, tympanic membrane, external ear and ear canal normal.  Left Ear: Hearing, tympanic membrane, external ear and ear canal normal.  Nose: Nose normal.  Mouth/Throat: Uvula is midline and mucous membranes are normal. Posterior oropharyngeal edema (mild) and posterior oropharyngeal erythema present. No oropharyngeal exudate or tonsillar abscesses.  Eyes: Conjunctivae are normal. No scleral icterus.  Neck: Normal range of motion. Neck supple.  Cardiovascular: Normal rate, regular rhythm and normal heart  sounds.   Pulmonary/Chest: Effort normal and breath sounds normal. No respiratory distress. She has no wheezes. She has no rales. She exhibits no tenderness.  Abdominal: Soft. She exhibits no distension and no mass. There is no tenderness. There is no rebound and no guarding.  Musculoskeletal: Normal range of motion.  Lymphadenopathy:    She has no cervical adenopathy.  Neurological: She is alert.  Skin: Skin is warm and dry. She is not diaphoretic.  Nursing note and vitals reviewed.   ED Course  Procedures (including critical care time)  Labs Review Labs Reviewed  POCT RAPID STREP A (OFFICE)    Imaging Review No results found.    MDM   1. Sore throat   2. Cough    Pt c/o sore throat. Concerned for strep. No evidence of peritonsillar abscess. Rapid strep: negative  Rx: Tessalon for cough.  Pt and her PCP aware of elevated BP.  Pt denies chest pain, headache or dizziness.  May take Dayquil and Nyquil for cough, congestion, and pain.  F/u with PCP in 1 week if not improving, sooner if worsening.     Junius Finnerrin O'Malley, PA-C 04/16/15 1829

## 2015-04-16 NOTE — Discharge Instructions (Signed)

## 2015-04-16 NOTE — ED Notes (Signed)
Pt c/o sore throat and post nasal drainage x 1 day. Denies fever.

## 2015-05-13 ENCOUNTER — Encounter: Payer: Self-pay | Admitting: Family Medicine

## 2015-05-13 ENCOUNTER — Ambulatory Visit (INDEPENDENT_AMBULATORY_CARE_PROVIDER_SITE_OTHER): Payer: BC Managed Care – PPO | Admitting: Family Medicine

## 2015-05-13 VITALS — BP 154/93 | HR 80 | Wt 215.0 lb

## 2015-05-13 DIAGNOSIS — I10 Essential (primary) hypertension: Secondary | ICD-10-CM

## 2015-05-13 DIAGNOSIS — E282 Polycystic ovarian syndrome: Secondary | ICD-10-CM

## 2015-05-13 MED ORDER — AMLODIPINE BESYLATE 5 MG PO TABS: 5.0000 mg | ORAL_TABLET | Freq: Every day | ORAL | Status: DC

## 2015-05-13 NOTE — Progress Notes (Signed)
   Subjective:    Patient ID: [name], female    DOB: 07-08-1975, 39 y.o.   MRN: [id number]  HPI PCOS - doing well with the spironolactone and the metformin.   She is tolerated it well without any side effects. She did have some loose stools the first week of starting the metformin but that has used off and she is doing great with it now.his also been started tolerating this  spironolactone well withut any side effects.  Hypertension- Pt denies chest pain, SOB, dizziness, or heart palpitations.  Taking meds as directed w/o problems.  Denies medication side effects.      Review of Systems     Objective:   Physical Exam  Constitutional: She is oriented to person, place, and time. She appears well-developed and well-nourished.  HENT:  Head: Normocephalic and atraumatic.  Cardiovascular: Normal rate, regular rhythm and normal heart sounds.   Pulmonary/Chest: Effort normal and breath sounds normal.  Neurological: She is alert and oriented to person, place, and time.  Skin: Skin is warm and dry.  Psychiatric: She has a normal mood and affect. Her behavior is normal.          Assessment & Plan:  PCOS - Doing well on combo of metformin and spironolactone. Work on diet and exercise and weight loss.    HTN - uncontrolled. Will add amlodipine. F/u in 6 weeks.

## 2015-06-24 ENCOUNTER — Encounter: Payer: Self-pay | Admitting: Family Medicine

## 2015-06-24 ENCOUNTER — Ambulatory Visit (INDEPENDENT_AMBULATORY_CARE_PROVIDER_SITE_OTHER): Payer: BC Managed Care – PPO | Admitting: Family Medicine

## 2015-06-24 VITALS — BP 137/93 | HR 83 | Wt 213.0 lb

## 2015-06-24 DIAGNOSIS — I10 Essential (primary) hypertension: Secondary | ICD-10-CM

## 2015-06-24 DIAGNOSIS — E282 Polycystic ovarian syndrome: Secondary | ICD-10-CM | POA: Diagnosis not present

## 2015-06-24 NOTE — Progress Notes (Signed)
   Subjective:    Patient ID: [name], female    DOB: [birth year], 40 y.o.   MRN: [id number]  HPI Hypertension- Pt denies chest pain, SOB, dizziness, or heart palpitations.  Taking meds as directed w/o problems.  Denies medication side effects.  She didn't take her medication this morning.  She didn't eat so didn't take it.   PCOS - on spirinolactone and metformin.  We changed the metformin ER about 2 months ago bc of side effects.  Also added spironolactone for hair growth.  Has noticed hair coming in thinner but still having to wax every 3 weeks.     Review of Systems     Objective:   Physical Exam  Constitutional: She is oriented to person, place, and time. She appears well-developed and well-nourished.  HENT:  Head: Normocephalic and atraumatic.  Cardiovascular: Normal rate, regular rhythm and normal heart sounds.   Pulmonary/Chest: Effort normal and breath sounds normal.  Neurological: She is alert and oriented to person, place, and time.  Skin: Skin is warm and dry.  Psychiatric: She has a normal mood and affect. Her behavior is normal.          Assessment & Plan:  HTN - Uncontrolled but skipped med this AM.  Come back in in 2 weeks for nurse BP check. Due for CMP to check electrolytes.    PCOS - continue with spironolactone for at least 6 months to get the full effect. Continue metformin. She says Fraser Din release version has worked great without any side effects that she was previously getting with the short-acting version.

## 2015-06-24 NOTE — Progress Notes (Signed)
   Subjective:    Patient ID: [name], female    DOB: 04/25/1976, 40 y.o.   MRN: [id number]  HPI    Review of Systems     Objective:   Physical Exam        Assessment & Plan:

## 2015-07-11 ENCOUNTER — Ambulatory Visit (INDEPENDENT_AMBULATORY_CARE_PROVIDER_SITE_OTHER): Payer: BC Managed Care – PPO | Admitting: Family Medicine

## 2015-07-11 VITALS — BP 151/99 | HR 85

## 2015-07-11 DIAGNOSIS — I10 Essential (primary) hypertension: Secondary | ICD-10-CM

## 2015-07-11 MED ORDER — AMLODIPINE BESYLATE 10 MG PO TABS: 10.0000 mg | ORAL_TABLET | Freq: Every day | ORAL | Status: DC

## 2015-07-11 NOTE — Progress Notes (Signed)
   Subjective:    Patient ID: [name], female    DOB: Jan 10, 1976, 40 y.o.   MRN: [id number]  HPI  Pt presents to clinic for a 2 week BP check. -Olivia Mackie, RMA  Review of Systems     Objective:   Physical Exam  Constitutional: She is oriented to person, place, and time. She appears well-developed and well-nourished.  HENT:  Head: Normocephalic and atraumatic.  Cardiovascular: Normal rate, regular rhythm and normal heart sounds.   Pulmonary/Chest: Effort normal and breath sounds normal.  Neurological: She is alert and oriented to person, place, and time.  Skin: Skin is warm and dry.  Psychiatric: She has a normal mood and affect. Her behavior is normal.          Assessment & Plan:  Hypertension-I last saw her a few weeks ago she had not taken her blood pressure pill that day so we have decided to have her come back on the medication just to make sure that her pressures are well controlled. Unfortunately her pressure is still high today. We discussed several options for adjusting her medication regimen. Would also like to consider testing her for primary aldosteronism. We'll also increase her amlodipine to 10 mg. She can increase her dose after she gets the blood work done. Continue with spironolactone. I like to see her back in 4-5 weeks to recheck blood pressure to make sure that we are getting back under control. Continue work on diet and exercise which I think would have a tremendous impact on her blood pressure.

## 2015-07-12 ENCOUNTER — Encounter: Payer: Self-pay | Admitting: Family Medicine

## 2015-07-18 LAB — CBC WITH DIFFERENTIAL/PLATELET
BASOS ABS: 0 10*3/uL (ref 0.0–0.1)
BASOS PCT: 1 % (ref 0–1)
EOS ABS: 0.1 10*3/uL (ref 0.0–0.7)
EOS PCT: 2 % (ref 0–5)
HCT: 43.5 % (ref 36.0–46.0)
Hemoglobin: 14.9 g/dL (ref 12.0–15.0)
LYMPHS PCT: 40 % (ref 12–46)
Lymphs Abs: 1.8 10*3/uL (ref 0.7–4.0)
MCH: 29.9 pg (ref 26.0–34.0)
MCHC: 34.3 g/dL (ref 30.0–36.0)
MCV: 87.2 fL (ref 78.0–100.0)
MPV: 11 fL (ref 8.6–12.4)
Monocytes Absolute: 0.4 10*3/uL (ref 0.1–1.0)
Monocytes Relative: 10 % (ref 3–12)
Neutro Abs: 2.1 10*3/uL (ref 1.7–7.7)
Neutrophils Relative %: 47 % (ref 43–77)
PLATELETS: 271 10*3/uL (ref 150–400)
RBC: 4.99 MIL/uL (ref 3.87–5.11)
RDW: 14 % (ref 11.5–15.5)
WBC: 4.4 10*3/uL (ref 4.0–10.5)

## 2015-07-24 LAB — ALDOSTERONE + RENIN ACTIVITY W/ RATIO
ALDO / PRA Ratio: 3.9 Ratio (ref 0.9–28.9)
Aldosterone: 15 ng/dL
PRA LC/MS/MS: 3.81 ng/mL/h (ref 0.25–5.82)

## 2015-08-08 ENCOUNTER — Encounter: Payer: Self-pay | Admitting: Family Medicine

## 2015-08-08 ENCOUNTER — Ambulatory Visit (INDEPENDENT_AMBULATORY_CARE_PROVIDER_SITE_OTHER): Payer: BC Managed Care – PPO | Admitting: Family Medicine

## 2015-08-08 VITALS — BP 120/86 | HR 97 | Wt 215.0 lb

## 2015-08-08 DIAGNOSIS — I10 Essential (primary) hypertension: Secondary | ICD-10-CM | POA: Diagnosis not present

## 2015-08-08 NOTE — Progress Notes (Signed)
   Subjective:    Patient ID: [name], female    DOB: 1976-02-12, 40 y.o.   MRN: [id number]  HPI  She is here today to follow-up on hypertension. When I last saw her we increased the amlodipine to 10 mg. She's been tolerating that well without any side effects or ankle swelling. She's not had any chest pain or shortness of breath. This last week has been especially stressful. She's actually been living with her mother as they are painting her home. They will be moving back into their house this weekend. Because they are staying there this week her children have been sleeping with her so she really has not had much sleep over the last 4 nights as well.  Review of Systems     Objective:   Physical Exam  Constitutional: She is oriented to person, place, and time. She appears well-developed and well-nourished.  HENT:  Head: Normocephalic and atraumatic.  Cardiovascular: Normal rate, regular rhythm and normal heart sounds.   Pulmonary/Chest: Effort normal and breath sounds normal.  Neurological: She is alert and oriented to person, place, and time.  Skin: Skin is warm and dry.  Psychiatric: She has a normal mood and affect. Her behavior is normal.        Assessment & Plan:  Hypertension-we'll continue with current regimen.F/U in 4 months.

## 2015-09-25 ENCOUNTER — Other Ambulatory Visit: Payer: Self-pay | Admitting: Family Medicine

## 2015-10-04 ENCOUNTER — Other Ambulatory Visit: Payer: Self-pay | Admitting: Family Medicine

## 2015-12-05 ENCOUNTER — Encounter: Payer: Self-pay | Admitting: Family Medicine

## 2015-12-05 ENCOUNTER — Ambulatory Visit (INDEPENDENT_AMBULATORY_CARE_PROVIDER_SITE_OTHER): Payer: BC Managed Care – PPO | Admitting: Family Medicine

## 2015-12-05 VITALS — BP 139/89 | HR 67 | Wt 219.0 lb

## 2015-12-05 DIAGNOSIS — E282 Polycystic ovarian syndrome: Secondary | ICD-10-CM | POA: Diagnosis not present

## 2015-12-05 DIAGNOSIS — Z800 Family history of malignant neoplasm of digestive organs: Secondary | ICD-10-CM

## 2015-12-05 DIAGNOSIS — I10 Essential (primary) hypertension: Secondary | ICD-10-CM | POA: Diagnosis not present

## 2015-12-05 LAB — BASIC METABOLIC PANEL
BUN: 10 mg/dL (ref 7–25)
CHLORIDE: 101 mmol/L (ref 98–110)
CO2: 23 mmol/L (ref 20–31)
Calcium: 9.3 mg/dL (ref 8.6–10.2)
Creat: 0.74 mg/dL (ref 0.50–1.10)
GLUCOSE: 96 mg/dL (ref 65–99)
POTASSIUM: 4.2 mmol/L (ref 3.5–5.3)
SODIUM: 137 mmol/L (ref 135–146)

## 2015-12-05 LAB — POCT GLYCOSYLATED HEMOGLOBIN (HGB A1C): Hemoglobin A1C: 5.6

## 2015-12-05 NOTE — Patient Instructions (Signed)
DASH Eating Plan  DASH stands for "Dietary Approaches to Stop Hypertension." The DASH eating plan is a healthy eating plan that has been shown to reduce high blood pressure (hypertension). Additional health benefits may include reducing the risk of type 2 diabetes mellitus, heart disease, and stroke. The DASH eating plan may also help with weight loss.  WHAT DO I NEED TO KNOW ABOUT THE DASH EATING PLAN?  For the DASH eating plan, you will follow these general guidelines:  · Choose foods with a percent daily value for sodium of less than 5% (as listed on the food label).  · Use salt-free seasonings or herbs instead of table salt or sea salt.  · Check with your health care provider or pharmacist before using salt substitutes.  · Eat lower-sodium products, often labeled as "lower sodium" or "no salt added."  · Eat fresh foods.  · Eat more vegetables, fruits, and low-fat dairy products.  · Choose whole grains. Look for the word "whole" as the first word in the ingredient list.  · Choose fish and skinless chicken or turkey more often than red meat. Limit fish, poultry, and meat to 6 oz (170 g) each day.  · Limit sweets, desserts, sugars, and sugary drinks.  · Choose heart-healthy fats.  · Limit cheese to 1 oz (28 g) per day.  · Eat more home-cooked food and less restaurant, buffet, and fast food.  · Limit fried foods.  · Cook foods using methods other than frying.  · Limit canned vegetables. If you do use them, rinse them well to decrease the sodium.  · When eating at a restaurant, ask that your food be prepared with less salt, or no salt if possible.  WHAT FOODS CAN I EAT?  Seek help from a dietitian for individual calorie needs.  Grains  Whole grain or whole wheat bread. Brown rice. Whole grain or whole wheat pasta. Quinoa, bulgur, and whole grain cereals. Low-sodium cereals. Corn or whole wheat flour tortillas. Whole grain cornbread. Whole grain crackers. Low-sodium crackers.  Vegetables  Fresh or frozen vegetables  (raw, steamed, roasted, or grilled). Low-sodium or reduced-sodium tomato and vegetable juices. Low-sodium or reduced-sodium tomato sauce and paste. Low-sodium or reduced-sodium canned vegetables.   Fruits  All fresh, canned (in natural juice), or frozen fruits.  Meat and Other Protein Products  Ground beef (85% or leaner), grass-fed beef, or beef trimmed of fat. Skinless chicken or turkey. Ground chicken or turkey. Pork trimmed of fat. All fish and seafood. Eggs. Dried beans, peas, or lentils. Unsalted nuts and seeds. Unsalted canned beans.  Dairy  Low-fat dairy products, such as skim or 1% milk, 2% or reduced-fat cheeses, low-fat ricotta or cottage cheese, or plain low-fat yogurt. Low-sodium or reduced-sodium cheeses.  Fats and Oils  Tub margarines without trans fats. Light or reduced-fat mayonnaise and salad dressings (reduced sodium). Avocado. Safflower, olive, or canola oils. Natural peanut or almond butter.  Other  Unsalted popcorn and pretzels.  The items listed above may not be a complete list of recommended foods or beverages. Contact your dietitian for more options.  WHAT FOODS ARE NOT RECOMMENDED?  Grains  White bread. White pasta. White rice. Refined cornbread. Bagels and croissants. Crackers that contain trans fat.  Vegetables  Creamed or fried vegetables. Vegetables in a cheese sauce. Regular canned vegetables. Regular canned tomato sauce and paste. Regular tomato and vegetable juices.  Fruits  Dried fruits. Canned fruit in light or heavy syrup. Fruit juice.  Meat and Other Protein   Products  Fatty cuts of meat. Ribs, chicken wings, bacon, sausage, bologna, salami, chitterlings, fatback, hot dogs, bratwurst, and packaged luncheon meats. Salted nuts and seeds. Canned beans with salt.  Dairy  Whole or 2% milk, cream, half-and-half, and cream cheese. Whole-fat or sweetened yogurt. Full-fat cheeses or blue cheese. Nondairy creamers and whipped toppings. Processed cheese, cheese spreads, or cheese  curds.  Condiments  Onion and garlic salt, seasoned salt, table salt, and sea salt. Canned and packaged gravies. Worcestershire sauce. Tartar sauce. Barbecue sauce. Teriyaki sauce. Soy sauce, including reduced sodium. Steak sauce. Fish sauce. Oyster sauce. Cocktail sauce. Horseradish. Ketchup and mustard. Meat flavorings and tenderizers. Bouillon cubes. Hot sauce. Tabasco sauce. Marinades. Taco seasonings. Relishes.  Fats and Oils  Butter, stick margarine, lard, shortening, ghee, and bacon fat. Coconut, palm kernel, or palm oils. Regular salad dressings.  Other  Pickles and olives. Salted popcorn and pretzels.  The items listed above may not be a complete list of foods and beverages to avoid. Contact your dietitian for more information.  WHERE CAN I FIND MORE INFORMATION?  National Heart, Lung, and [employer] Institute: www.nhlbi.nih.gov/health/health-topics/topics/dash/     This information is not intended to replace advice given to you by your health care provider. Make sure you discuss any questions you have with your health care provider.     Document Released: 04/30/2011 Document Revised: 06/01/2014 Document Reviewed: 03/15/2013  Elsevier Interactive Patient Education ©2016 [employer] Inc[employer]

## 2015-12-05 NOTE — Progress Notes (Signed)
Subjective:    CC: HTN HPI: Hypertension- Pt denies chest pain, SOB, dizziness, or heart palpitations.  Taking meds as directed w/o problems.  Denies medication side effects.  Her weight is up 4 lbs.    PCOS - on spironolactone and metformin ER. Doing well. Still waxing facial hair.  She definitely prefers the ER version. Much less GI upset esp with her IBS.   Past medical history, Surgical history, Family history not pertinant except as noted below, Social history, Allergies, and medications have been entered into the medical record, reviewed, and corrections made.   Review of Systems: No fevers, chills, night sweats, weight loss, chest pain, or shortness of breath.   Objective:    General: Well Developed, well nourished, and in no acute distress.  Neuro: Alert and oriented x3, extra-ocular muscles intact, sensation grossly intact.  HEENT: Normocephalic, atraumatic , No thyromegaly. Skin: Warm and dry, no rashes. Cardiac: Regular rate and rhythm, no murmurs rubs or gallops, no lower extremity edema.  Respiratory: Clear to auscultation bilaterally. Not using accessory muscles, speaking in full sentences.   Impression and Recommendations:   HTN - well controlled but borderline. Will get an eye on it. Continue amlodipine and spironolactone. Follow-up in 6 months.  PCOS - A1C of 5.6 today.  Continue current regimen. Follow-up in 6-12 months. Due for BMP.

## 2015-12-06 LAB — CANCER ANTIGEN 19-9: CA 19-9: 24 U/mL (ref <34)

## 2016-01-06 ENCOUNTER — Ambulatory Visit (INDEPENDENT_AMBULATORY_CARE_PROVIDER_SITE_OTHER): Payer: BC Managed Care – PPO | Admitting: Family Medicine

## 2016-01-06 VITALS — BP 132/85 | HR 81 | Wt 223.0 lb

## 2016-01-06 DIAGNOSIS — I10 Essential (primary) hypertension: Secondary | ICD-10-CM

## 2016-01-06 NOTE — Progress Notes (Signed)
Patient is here for blood pressure check. Denies any trouble sleeping, palpitations, or any other medication problems. Pt BP was good in office today, will route to PCP for review. Pt advised we will contact her if there are any changes made. Verbalized understanding, no further questions.

## 2016-01-07 NOTE — Progress Notes (Signed)
BP at goal today. Will continue to monitor.   Nani Gasseratherine Metheney, MD/

## 2016-01-13 ENCOUNTER — Other Ambulatory Visit: Payer: Self-pay | Admitting: Family Medicine

## 2016-03-18 ENCOUNTER — Other Ambulatory Visit: Payer: Self-pay | Admitting: Family Medicine

## 2016-03-29 ENCOUNTER — Other Ambulatory Visit: Payer: Self-pay | Admitting: Family Medicine

## 2016-04-06 ENCOUNTER — Encounter: Payer: Self-pay | Admitting: Family Medicine

## 2016-04-06 ENCOUNTER — Ambulatory Visit (INDEPENDENT_AMBULATORY_CARE_PROVIDER_SITE_OTHER): Payer: BC Managed Care – PPO | Admitting: Family Medicine

## 2016-04-06 VITALS — BP 138/84 | HR 65 | Wt 219.0 lb

## 2016-04-06 DIAGNOSIS — E282 Polycystic ovarian syndrome: Secondary | ICD-10-CM

## 2016-04-06 DIAGNOSIS — Z7689 Persons encountering health services in other specified circumstances: Secondary | ICD-10-CM | POA: Insufficient documentation

## 2016-04-06 DIAGNOSIS — Z0000 Encounter for general adult medical examination without abnormal findings: Secondary | ICD-10-CM

## 2016-04-06 LAB — COMPLETE METABOLIC PANEL WITH GFR
ALBUMIN: 4.3 g/dL (ref 3.6–5.1)
ALK PHOS: 56 U/L (ref 33–115)
ALT: 23 U/L (ref 6–29)
AST: 20 U/L (ref 10–30)
BILIRUBIN TOTAL: 0.5 mg/dL (ref 0.2–1.2)
BUN: 9 mg/dL (ref 7–25)
CO2: 30 mmol/L (ref 20–31)
Calcium: 9.4 mg/dL (ref 8.6–10.2)
Chloride: 101 mmol/L (ref 98–110)
Creat: 0.67 mg/dL (ref 0.50–1.10)
Glucose, Bld: 88 mg/dL (ref 65–99)
POTASSIUM: 4.3 mmol/L (ref 3.5–5.3)
SODIUM: 138 mmol/L (ref 135–146)
TOTAL PROTEIN: 6.6 g/dL (ref 6.1–8.1)

## 2016-04-06 LAB — HEMOGLOBIN A1C
Hgb A1c MFr Bld: 5.5 % (ref <5.7)
Mean Plasma Glucose: 111 mg/dL

## 2016-04-06 LAB — LIPID PANEL
CHOL/HDL RATIO: 3.7 ratio (ref <5.0)
Cholesterol: 172 mg/dL (ref <200)
HDL: 47 mg/dL — AB (ref >50)
LDL Cholesterol: 100 mg/dL — ABNORMAL HIGH (ref <100)
TRIGLYCERIDES: 125 mg/dL (ref <150)
VLDL: 25 mg/dL (ref <30)

## 2016-04-06 NOTE — Progress Notes (Signed)
Subjective:     [name] is a 40 y.o. female and is here for a comprehensive physical exam. The patient reports no problems.  Social History   Social History  . Marital status: Married    Spouse name: N/A  . Number of children: 2  . Years of education: N/A   Occupational History  . [employer]   Social History Main Topics  . Smoking status: Never Smoker  . Smokeless tobacco: Never Used  . Alcohol use No  . Drug use: No  . Sexual activity: Yes    Partners: Male   Other Topics Concern  . Not on file   Social History Narrative   No regular exercise.          Health Maintenance  Topic Date Due  . INFLUENZA VACCINE  12/24/2015  . TETANUS/TDAP  03/26/2018  . PAP SMEAR  03/31/2018  . HIV Screening  Completed    The following portions of the patient's history were reviewed and updated as appropriate: allergies, current medications, past family history, past medical history, past social history, past surgical history and problem list.  Review of Systems A comprehensive review of systems was negative.   Objective:    BP (!) 144/98   Pulse 65   Wt 219 lb (99.3 kg)   SpO2 99%   BMI 42.77 kg/m   General appearance: alert, cooperative and appears stated age Head: Normocephalic, without obvious abnormality, atraumatic Eyes: EOM, PERRLA Ears: normal TM's and external ear canals both ears Nose: Nares normal. Septum midline. Mucosa normal. No drainage or sinus tenderness. Throat: lips, mucosa, and tongue normal; teeth and gums normal Neck: no adenopathy, no carotid bruit, no JVD, supple, symmetrical, trachea midline and thyroid not enlarged, symmetric, no tenderness/mass/nodules Back: symmetric, no curvature. ROM normal. No CVA tenderness. Lungs: clear to auscultation bilaterally Breasts: normal appearance, no masses or tenderness Heart: regular rate and rhythm, S1, S2 normal, no murmur, click, rub or gallop Abdomen: soft, non-tender; bowel sounds  normal; no masses,  no organomegaly Extremities: extremities normal, atraumatic, no cyanosis or edema Pulses: 2+ and symmetric Skin: Skin color, texture, turgor normal. No rashes or lesions Lymph nodes: Cervical, supraclavicular, and axillary nodes normal. Neurologic: Alert and oriented X 3, normal strength and tone. Normal symmetric reflexes. Normal coordination and gait    Assessment:    Healthy female exam.     Plan:     See After Visit Summary for Counseling Recommendations   Keep up a regular exercise program and make sure you are eating a healthy diet Try to eat 4 servings of dairy a day, or if you are lactose intolerant take a calcium with vitamin D daily.  Your vaccines are up to date.

## 2016-04-06 NOTE — Patient Instructions (Signed)
Keep up a regular exercise program and make sure you are eating a healthy diet Try to eat 4 servings of dairy a day, or if you are lactose intolerant take a calcium with vitamin D daily.  Your vaccines are up to date.   

## 2016-08-09 ENCOUNTER — Emergency Department (INDEPENDENT_AMBULATORY_CARE_PROVIDER_SITE_OTHER): Payer: BC Managed Care – PPO

## 2016-08-09 ENCOUNTER — Encounter: Payer: Self-pay | Admitting: Emergency Medicine

## 2016-08-09 ENCOUNTER — Emergency Department
Admission: EM | Admit: 2016-08-09 | Discharge: 2016-08-09 | Disposition: A | Discharge status: Home / Self Care | Payer: BC Managed Care – PPO | Source: Home / Self Care | Attending: Family Medicine | Admitting: Family Medicine

## 2016-08-09 DIAGNOSIS — R053 Chronic cough: Secondary | ICD-10-CM

## 2016-08-09 DIAGNOSIS — R05 Cough: Secondary | ICD-10-CM

## 2016-08-09 MED ORDER — GUAIFENESIN-CODEINE 100-10 MG/5ML PO SOLN: ORAL | 0 refills | Status: DC

## 2016-08-09 MED ORDER — AZITHROMYCIN 250 MG PO TABS: ORAL_TABLET | ORAL | 0 refills | Status: DC

## 2016-08-09 MED ORDER — PREDNISONE 20 MG PO TABS: ORAL_TABLET | ORAL | 0 refills | Status: DC

## 2016-08-09 NOTE — Discharge Instructions (Signed)
Take plain guaifenesin (1200mg extended release tabs such as Mucinex) twice daily, with plenty of water, for cough and congestion.  Get adequate rest.   °Stop all antihistamines for now, and other non-prescription cough/cold preparations. °  ° °

## 2016-08-09 NOTE — ED Provider Notes (Signed)
[facility]    CSN: [id number] Arrival date & time: 08/09/16  1440     History   Chief Complaint Chief Complaint  Patient presents with  . Cough    HPI [name] is a 41 y.o. female.   Patient complains of persistent cough and sinus congestion for two weeks.  Her cough has become worse during the past 24 hours.  She has a history of seasonal allergies, and asthma as a child.   The history is provided by the patient.    Past Medical History:  Diagnosis Date  . Hypertension   . IBS (irritable bowel syndrome)   . PCOS (polycystic ovarian syndrome)     Patient Active Problem List   Diagnosis Date Noted  . Routine general medical examination at a health care facility 04/06/2016  . Family history of pancreatic cancer 12/05/2015  . Essential hypertension 06/24/2015  . S/P cesarean section 02/02/2011  . POLYCYSTIC OVARIAN DISEASE 03/26/2008  . Hyperlipidemia 03/26/2008  . COMMON MIGRAINE 03/26/2008  . IBS 03/26/2008    Past Surgical History:  Procedure Laterality Date  . CESAREAN SECTION  2008  . CESAREAN SECTION  02/02/2011   Procedure: CESAREAN SECTION;  Surgeon: Jeani HawkingMichelle L Grewal, MD;  Location: [facility];  Service: Gynecology;  Laterality: N/A;  . ESSURE TUBAL LIGATION  03/26/11    OB History    Gravida Para Term Preterm AB Living   4 3 3     3    SAB TAB Ectopic Multiple Live Births           1       Home Medications    Prior to Admission medications   Medication Sig Start Date End Date Taking? Authorizing Provider  amLODipine (NORVASC) 10 MG tablet TAKE 1 TABLET (10 MG TOTAL) BY MOUTH DAILY. 03/30/16   Agapito Gamesatherine D Metheney, MD  azithromycin (ZITHROMAX Z-PAK) 250 MG tablet Take 2 tabs today; then begin one tab once daily for 4 more days. 08/09/16   Lattie HawStephen A Beese, MD  guaiFENesin-codeine 100-10 MG/5ML syrup Take 10mL by mouth at bedtime as needed for cough 08/09/16   Lattie HawStephen A Beese, MD  metFORMIN (GLUCOPHAGE-XR) 500 MG 24 hr tablet TAKE 1  TABLET (500 MG TOTAL) BY MOUTH DAILY WITH BREAKFAST. 03/18/16   Agapito Gamesatherine D Metheney, MD  predniSONE (DELTASONE) 20 MG tablet Take one tab by mouth twice daily for 5 days, then one daily. Take with food. 08/09/16   Lattie HawStephen A Beese, MD  spironolactone (ALDACTONE) 25 MG tablet TAKE 1 TABLET (25 MG TOTAL) BY MOUTH DAILY. 03/18/16   Agapito Gamesatherine D Metheney, MD    Family History Family History  Problem Relation Age of Onset  . Hypertension    . Heart attack Paternal Grandmother   . Diabetes Mother   . Hypertension Mother   . Hyperlipidemia Mother   . Cancer Father     pancreatic    Social History Social History  Substance Use Topics  . Smoking status: Never Smoker  . Smokeless tobacco: Never Used  . Alcohol use No     Allergies   Patient has no known allergies.   Review of Systems Review of Systems No sore throat + cough No pleuritic pain No wheezing + nasal congestion + post-nasal drainage No sinus pain/pressure No itchy/red eyes No earache No hemoptysis No SOB No fever/chills No nausea No vomiting No abdominal pain No diarrhea No urinary symptoms No skin rash + fatigue No myalgias No headache Used OTC meds  without relief   Physical Exam Triage Vital Signs ED Triage Vitals  Enc Vitals Group     BP 08/09/16 1517 (!) 152/85     Pulse Rate 08/09/16 1517 (!) 105     Resp 08/09/16 1517 18     Temp 08/09/16 1517 98 F (36.7 C)     Temp Source 08/09/16 1517 Oral     SpO2 08/09/16 1517 97 %     Weight 08/09/16 1518 220 lb (99.8 kg)     Height 08/09/16 1518 5' (1.524 m)     Head Circumference --      Peak Flow --      Pain Score 08/09/16 1519 0     Pain Loc --      Pain Edu? --      Excl. in GC? --    No data found.   Updated Vital Signs BP (!) 152/85 (BP Location: Left Arm) Comment: did not take anti-hypertensive med this morning  Pulse (!) 105   Temp 98 F (36.7 C) (Oral)   Resp 18   Ht 5' (1.524 m)   Wt 220 lb (99.8 kg)   LMP 07/23/2016   SpO2  97%   BMI 42.97 kg/m   Visual Acuity Right Eye Distance:   Left Eye Distance:   Bilateral Distance:    Right Eye Near:   Left Eye Near:    Bilateral Near:     Physical Exam Nursing notes and Vital Signs reviewed. Appearance:  Patient appears stated age, and in no acute distress Eyes:  Pupils are equal, round, and reactive to light and accomodation.  Extraocular movement is intact.  Conjunctivae are not inflamed  Ears:  Canals normal.  Tympanic membranes normal.  Nose:  Mildly congested turbinates.  No sinus tenderness.  Pharynx:  Normal Neck:  Supple.  Nontender enlarged posterior/lateral nodes are palpated bilaterally  Lungs:  Clear to auscultation.  Breath sounds are equal.  Moving air well. Heart:  Regular rate and rhythm without murmurs, rubs, or gallops.  Abdomen:  Nontender without masses or hepatosplenomegaly.  Bowel sounds are present.  No CVA or flank tenderness.  Extremities:  No edema.  Skin:  No rash present.    UC Treatments / Results  Labs (all labs ordered are listed, but only abnormal results are displayed) Labs Reviewed - No data to display  EKG  EKG Interpretation None       Radiology Dg Chest 2 View  Result Date: 08/09/2016 CLINICAL DATA:  Worsening cough for the past 2 weeks. EXAM: CHEST  2 VIEW COMPARISON:  02/06/2013. FINDINGS: Normal sized heart. Clear lungs. Mild upper thoracic spine degenerative changes. IMPRESSION: No acute abnormality. Electronically Signed   By: Beckie Salts M.D.   On: 08/09/2016 15:36    Procedures Procedures (including critical care time)  Medications Ordered in UC Medications - No data to display   Initial Impression / Assessment and Plan / UC Course  I have reviewed the triage vital signs and the nursing notes.  Pertinent labs & imaging results that were available during my care of the patient were reviewed by me and considered in my medical decision making (see chart for details).    Begin Z-pak for atypical  coverage, and prednisone burst/taper. Rx for Robitussin AC for night time cough.  Take plain guaifenesin (1200mg  extended release tabs such as Mucinex) twice daily, with plenty of water, for cough and congestion.  Get adequate rest.    Stop all antihistamines for now,  and other non-prescription cough/cold preparations. Followup with Family Doctor if not improved in about 8 days.    Final Clinical Impressions(s) / UC Diagnoses   Final diagnoses:  Persistent cough    New Prescriptions New Prescriptions   AZITHROMYCIN (ZITHROMAX Z-PAK) 250 MG TABLET    Take 2 tabs today; then begin one tab once daily for 4 more days.   GUAIFENESIN-CODEINE 100-10 MG/5ML SYRUP    Take 10mL by mouth at bedtime as needed for cough   PREDNISONE (DELTASONE) 20 MG TABLET    Take one tab by mouth twice daily for 5 days, then one daily. Take with food.     Lattie Haw, MD 08/16/16 [phone]

## 2016-08-09 NOTE — ED Triage Notes (Signed)
Patient states she has had a cough for 2 weeks. Took Ny-quil this morning.

## 2016-09-10 ENCOUNTER — Other Ambulatory Visit: Payer: Self-pay | Admitting: Family Medicine

## 2016-09-14 ENCOUNTER — Other Ambulatory Visit: Payer: Self-pay | Admitting: Family Medicine

## 2016-10-05 ENCOUNTER — Ambulatory Visit: Payer: BC Managed Care – PPO | Admitting: Family Medicine

## 2016-10-06 ENCOUNTER — Ambulatory Visit (INDEPENDENT_AMBULATORY_CARE_PROVIDER_SITE_OTHER): Payer: BC Managed Care – PPO | Admitting: Family Medicine

## 2016-10-06 ENCOUNTER — Encounter: Payer: Self-pay | Admitting: Family Medicine

## 2016-10-06 VITALS — BP 138/90 | HR 88 | Ht 60.0 in | Wt 223.0 lb

## 2016-10-06 DIAGNOSIS — I10 Essential (primary) hypertension: Secondary | ICD-10-CM | POA: Diagnosis not present

## 2016-10-06 DIAGNOSIS — R7301 Impaired fasting glucose: Secondary | ICD-10-CM | POA: Diagnosis not present

## 2016-10-06 LAB — BASIC METABOLIC PANEL WITH GFR
BUN: 11 mg/dL (ref 7–25)
CHLORIDE: 101 mmol/L (ref 98–110)
CO2: 21 mmol/L (ref 20–31)
Calcium: 9.4 mg/dL (ref 8.6–10.2)
Creat: 0.63 mg/dL (ref 0.50–1.10)
GFR, Est African American: >89 mL/min (ref >=60)
GFR, Est Non African American: >89 mL/min (ref >=60)
Glucose, Bld: 104 mg/dL — ABNORMAL HIGH (ref 65–99)
POTASSIUM: 4 mmol/L (ref 3.5–5.3)
Sodium: 137 mmol/L (ref 135–146)

## 2016-10-06 LAB — POCT GLYCOSYLATED HEMOGLOBIN (HGB A1C): HEMOGLOBIN A1C: 5.6

## 2016-10-06 MED ORDER — METOPROLOL SUCCINATE ER 25 MG PO TB24: 25.0000 mg | ORAL_TABLET | Freq: Every day | ORAL | 2 refills | Status: DC

## 2016-10-06 NOTE — Progress Notes (Signed)
Subjective:    CC: HTN, IFG  HPI:  Impaired fasting glucose-no increased thirst or urination. No symptoms consistent with hypoglycemia.  Hypertension- Pt denies chest pain, SOB, dizziness, or heart palpitations.  Taking meds as directed w/o problems.  Denies medication side effects.  He says she has noticed some headaches a little bit more frequently    Past medical history, Surgical history, Family history not pertinant except as noted below, Social history, Allergies, and medications have been entered into the medical record, reviewed, and corrections made.   Review of Systems: No fevers, chills, night sweats, weight loss, chest pain, or shortness of breath.   Objective:    General: Well Developed, well nourished, and in no acute distress.  Neuro: Alert and oriented x3, extra-ocular muscles intact, sensation grossly intact.  HEENT: Normocephalic, atraumatic  Skin: Warm and dry, no rashes. Cardiac: Regular rate and rhythm, no murmurs rubs or gallops, no lower extremity edema.  Respiratory: Clear to auscultation bilaterally. Not using accessory muscles, speaking in full sentences.   Impression and Recommendations:   HTN - Uncontrolled. Will start Toprol extended release 25 mg daily. I'll up in 2-3 weeks for nurse visit to make sure blood pressures at goal.  IFG - Uncontrolled. Hemoglobin A1c of 5.6 today. Continue current regimen. Follow-up in 6 months.

## 2016-10-07 NOTE — Progress Notes (Signed)
All labs are normal. 

## 2016-10-13 ENCOUNTER — Other Ambulatory Visit: Payer: Self-pay | Admitting: Family Medicine

## 2016-10-20 ENCOUNTER — Ambulatory Visit (INDEPENDENT_AMBULATORY_CARE_PROVIDER_SITE_OTHER): Payer: BC Managed Care – PPO | Admitting: Family Medicine

## 2016-10-20 ENCOUNTER — Encounter: Payer: Self-pay | Admitting: Family Medicine

## 2016-10-20 VITALS — BP 116/69 | HR 76 | Ht 60.0 in | Wt 223.0 lb

## 2016-10-20 DIAGNOSIS — I10 Essential (primary) hypertension: Secondary | ICD-10-CM | POA: Diagnosis not present

## 2016-10-20 MED ORDER — METOPROLOL SUCCINATE ER 25 MG PO TB24: 25.0000 mg | ORAL_TABLET | Freq: Every day | ORAL | 1 refills | Status: DC

## 2016-10-20 NOTE — Progress Notes (Signed)
   Subjective:    Patient ID: [name], female    DOB: 04-Mar-1976, 41 y.o.   MRN: [id number]  HPI Hypertension- Pt denies chest pain, SOB, dizziness, or heart palpitations.  Taking meds as directed w/o problems.  Denies medication side effects.  Started on metoprolol 25mg . Safar she's really been doing well with it. She's not noticed any side effects. She did have a little flare in her IBS this weekend. She wasn't sure if it was the medication or if it was because [facility]    Review of Systems     Objective:   Physical Exam  Constitutional: She is oriented to person, place, and time. She appears well-developed and well-nourished.  HENT:  Head: Normocephalic and atraumatic.  Cardiovascular: Normal rate, regular rhythm and normal heart sounds.   Pulmonary/Chest: Effort normal and breath sounds normal.  Neurological: She is alert and oriented to person, place, and time.  Skin: Skin is warm and dry.  Psychiatric: She has a normal mood and affect. Her behavior is normal.          Assessment & Plan:  HTN - Well controlled on current regimen. Refills sent for 90 days. Keep regular scheduled follow-up.

## 2016-12-08 ENCOUNTER — Telehealth: Payer: Self-pay | Admitting: Family Medicine

## 2016-12-08 DIAGNOSIS — Z1231 Encounter for screening mammogram for malignant neoplasm of breast: Secondary | ICD-10-CM

## 2016-12-08 NOTE — Telephone Encounter (Signed)
Order placed.[name]  

## 2016-12-08 NOTE — Telephone Encounter (Signed)
[facility]

## 2016-12-08 NOTE — Telephone Encounter (Signed)
Patient called scheduled a physical for 04/07/17 and would like to get a mammogram order put in to go on the same day as her physical. Thanks

## 2017-01-21 ENCOUNTER — Encounter: Payer: Self-pay | Admitting: Family Medicine

## 2017-01-21 ENCOUNTER — Ambulatory Visit (INDEPENDENT_AMBULATORY_CARE_PROVIDER_SITE_OTHER): Payer: BC Managed Care – PPO | Admitting: Family Medicine

## 2017-01-21 VITALS — BP 135/70 | HR 73 | Wt 231.0 lb

## 2017-01-21 DIAGNOSIS — I10 Essential (primary) hypertension: Secondary | ICD-10-CM

## 2017-01-21 DIAGNOSIS — K580 Irritable bowel syndrome with diarrhea: Secondary | ICD-10-CM

## 2017-01-21 DIAGNOSIS — E282 Polycystic ovarian syndrome: Secondary | ICD-10-CM

## 2017-01-21 NOTE — Progress Notes (Signed)
   Subjective:    Patient ID: [name], female    DOB: Sep 08, 1975, 41 y.o.   MRN: [id number]  HPI  Hypertension- Pt denies chest pain, SOB, dizziness, or heart palpitations.  Taking meds as directed w/o problems.  Denies medication side effects.    PCOS - she is doing well.  She did decide to hold the metformin for a few days. Sometimes it seems to flare her IBS and social hold it for a few days. She is doing fantastic though. Her last hemoglobin A1c was 5.6 in May.We'll plan to check it again in November.  IBS - D -  She has been having a few flares recently.she will hold the metformin when this happens.   Review of Systems     Objective:   Physical Exam  Constitutional: She is oriented to person, place, and time. She appears well-developed and well-nourished.  HENT:  Head: Normocephalic and atraumatic.  Cardiovascular: Normal rate, regular rhythm and normal heart sounds.   Pulmonary/Chest: Effort normal and breath sounds normal.  Neurological: She is alert and oriented to person, place, and time.  Skin: Skin is warm and dry.  Psychiatric: She has a normal mood and affect. Her behavior is normal.          Assessment & Plan:  HTN - Well controlled. Continue current regimen. Follow up in  6 months.   PCOS - stable. Continue current regimen. Okay to hold metformin if needed. Last A1c at goal.  IBS - D  - discussed that there are some perception medications that can be used. Particularly if quality of life starts to be impacted. Right now she doesn't miss any work because of it and does not want to take any prescription medication on a regular basis.

## 2017-03-05 ENCOUNTER — Other Ambulatory Visit: Payer: Self-pay | Admitting: Family Medicine

## 2017-04-05 ENCOUNTER — Other Ambulatory Visit: Payer: Self-pay

## 2017-04-05 DIAGNOSIS — E782 Mixed hyperlipidemia: Secondary | ICD-10-CM

## 2017-04-05 DIAGNOSIS — I10 Essential (primary) hypertension: Secondary | ICD-10-CM

## 2017-04-07 ENCOUNTER — Ambulatory Visit (INDEPENDENT_AMBULATORY_CARE_PROVIDER_SITE_OTHER): Payer: BC Managed Care – PPO | Admitting: Family Medicine

## 2017-04-07 ENCOUNTER — Telehealth: Payer: Self-pay | Admitting: Family Medicine

## 2017-04-07 ENCOUNTER — Ambulatory Visit (INDEPENDENT_AMBULATORY_CARE_PROVIDER_SITE_OTHER): Payer: BC Managed Care – PPO

## 2017-04-07 ENCOUNTER — Encounter: Payer: Self-pay | Admitting: Family Medicine

## 2017-04-07 VITALS — BP 130/67 | HR 75 | Ht 60.0 in | Wt 226.0 lb

## 2017-04-07 DIAGNOSIS — R7301 Impaired fasting glucose: Secondary | ICD-10-CM

## 2017-04-07 DIAGNOSIS — Z6841 Body Mass Index (BMI) 40.0 and over, adult: Secondary | ICD-10-CM | POA: Diagnosis not present

## 2017-04-07 DIAGNOSIS — I10 Essential (primary) hypertension: Secondary | ICD-10-CM | POA: Diagnosis not present

## 2017-04-07 DIAGNOSIS — Z1231 Encounter for screening mammogram for malignant neoplasm of breast: Secondary | ICD-10-CM

## 2017-04-07 DIAGNOSIS — Z0000 Encounter for general adult medical examination without abnormal findings: Secondary | ICD-10-CM

## 2017-04-07 LAB — CBC WITH DIFFERENTIAL/PLATELET
Basophils Absolute: 50 cells/uL (ref 0–200)
Basophils Relative: 0.9 %
EOS PCT: 2.9 %
Eosinophils Absolute: 162 cells/uL (ref 15–500)
HCT: 42.7 % (ref 35.0–45.0)
Hemoglobin: 14.7 g/dL (ref 11.7–15.5)
Lymphs Abs: 1943 cells/uL (ref 850–3900)
MCH: 29.3 pg (ref 27.0–33.0)
MCHC: 34.4 g/dL (ref 32.0–36.0)
MCV: 85.2 fL (ref 80.0–100.0)
MONOS PCT: 10.4 %
MPV: 11.1 fL (ref 7.5–12.5)
NEUTROS PCT: 51.1 %
Neutro Abs: 2862 cells/uL (ref 1500–7800)
PLATELETS: 265 10*3/uL (ref 140–400)
RBC: 5.01 10*6/uL (ref 3.80–5.10)
RDW: 13.5 % (ref 11.0–15.0)
TOTAL LYMPHOCYTE: 34.7 %
WBC mixed population: 582 cells/uL (ref 200–950)
WBC: 5.6 10*3/uL (ref 3.8–10.8)

## 2017-04-07 LAB — COMPLETE METABOLIC PANEL WITH GFR
AG RATIO: 1.7 (calc) (ref 1.0–2.5)
ALT: 29 U/L (ref 6–29)
AST: 24 U/L (ref 10–30)
Albumin: 4.3 g/dL (ref 3.6–5.1)
Alkaline phosphatase (APISO): 59 U/L (ref 33–115)
BUN: 12 mg/dL (ref 7–25)
CALCIUM: 9.6 mg/dL (ref 8.6–10.2)
CO2: 29 mmol/L (ref 20–32)
Chloride: 101 mmol/L (ref 98–110)
Creat: 0.66 mg/dL (ref 0.50–1.10)
GFR, EST NON AFRICAN AMERICAN: 110 mL/min/{1.73_m2} (ref >=60)
GFR, Est African American: 128 mL/min/{1.73_m2} (ref >=60)
Globulin: 2.6 g/dL (calc) (ref 1.9–3.7)
Glucose, Bld: 95 mg/dL (ref 65–99)
POTASSIUM: 4.3 mmol/L (ref 3.5–5.3)
Sodium: 138 mmol/L (ref 135–146)
Total Bilirubin: 0.7 mg/dL (ref 0.2–1.2)
Total Protein: 6.9 g/dL (ref 6.1–8.1)

## 2017-04-07 LAB — LIPID PANEL W/REFLEX DIRECT LDL
Cholesterol: 178 mg/dL (ref <200)
HDL: 48 mg/dL — AB (ref >50)
LDL Cholesterol (Calc): 103 mg/dL (calc) — ABNORMAL HIGH
NON-HDL CHOLESTEROL (CALC): 130 mg/dL — AB (ref <130)
TRIGLYCERIDES: 156 mg/dL — AB (ref <150)
Total CHOL/HDL Ratio: 3.7 (calc) (ref <5.0)

## 2017-04-07 LAB — POCT GLYCOSYLATED HEMOGLOBIN (HGB A1C): Hemoglobin A1C: 5.8

## 2017-04-07 NOTE — Telephone Encounter (Signed)
Call pt: have her come in for nurse visit in 2 week for repeat BP check since high today.

## 2017-04-07 NOTE — Progress Notes (Signed)
Subjective:     [name] is a 41 y.o. female and is here for a comprehensive physical exam. The patient reports no problems.  She has not been exercising consistently and admits she has been stress eating.  Her husband is finishing up school and so has not had a lot of time to herself and she has had some family members with some health problems and it has been somewhat stressful as well.  She does try to get about at least 7 hours of sleep each night.  Social History   Socioeconomic History  . Marital status: Married    Spouse name: Not on file  . Number of children: 2  . Years of education: Not on file  . Highest education level: Not on file  Social Needs  . Financial resource strain: Not on file  . Food insecurity - worry: Not on file  . Food insecurity - inability: Not on file  . Transportation needs - medical: Not on file  . Transportation needs - non-medical: Not on file  Occupational History  . Occupation: LIB    Employer: [employer]  Tobacco Use  . Smoking status: Never Smoker  . Smokeless tobacco: Never Used  Substance and Sexual Activity  . Alcohol use: No  . Drug use: No  . Sexual activity: Yes    Partners: Male  Other Topics Concern  . Not on file  Social History Narrative   No regular exercise.          Health Maintenance  Topic Date Due  . TETANUS/TDAP  03/26/2018  . PAP SMEAR  03/31/2018  . INFLUENZA VACCINE  Completed  . HIV Screening  Completed    The following portions of the patient's history were reviewed and updated as appropriate: allergies, current medications, past family history, past medical history, past social history, past surgical history and problem list.  Review of Systems A comprehensive review of systems was negative.   Objective:    BP (!) 157/87   Pulse 75   Ht 5' (1.524 m)   Wt 226 lb (102.5 kg)   LMP 03/10/2017   SpO2 98%   BMI 44.14 kg/m  General appearance: alert, cooperative and appears stated  age Head: Normocephalic, without obvious abnormality, atraumatic Eyes: conj clear, EOMI, PEERLA Ears: normal TM's and external ear canals both ears Nose: Nares normal. Septum midline. Mucosa normal. No drainage or sinus tenderness. Throat: lips, mucosa, and tongue normal; teeth and gums normal Neck: no adenopathy, no carotid bruit, no JVD, supple, symmetrical, trachea midline and thyroid not enlarged, symmetric, no tenderness/mass/nodules Back: symmetric, no curvature. ROM normal. No CVA tenderness. Lungs: clear to auscultation bilaterally Breasts: normal appearance, no masses or tenderness Heart: regular rate and rhythm, S1, S2 normal, no murmur, click, rub or gallop Abdomen: soft, non-tender; bowel sounds normal; no masses,  no organomegaly Extremities: extremities normal, atraumatic, no cyanosis or edema Pulses: 2+ and symmetric Skin: Skin color, texture, turgor normal. No rashes or lesions Lymph nodes: Cervical, supraclavicular, and axillary nodes normal. Neurologic: Alert and oriented X 3, normal strength and tone. Normal symmetric reflexes. Normal coordination and gait    Assessment:    Healthy female exam.      Plan:     See After Visit Summary for Counseling Recommendations    Keep up a regular exercise program and make sure you are eating a healthy diet Try to eat 4 servings of dairy a day, or if you are lactose intolerant  take a calcium with vitamin D daily.  Your vaccines are up to date.  mammo schedule.   HTN -  Uncontrolled. Will have her f/u in 2 weeks for repeat check with nurse visit.     BMI 44-we discussed some strategies around getting more active and working on making small changes including portion control etc.  We will follow-up and address again in 6 months.

## 2017-04-07 NOTE — Patient Instructions (Addendum)

## 2017-04-08 NOTE — Telephone Encounter (Signed)
Husband advised. 

## 2017-04-09 ENCOUNTER — Telehealth: Payer: Self-pay | Admitting: *Deleted

## 2017-04-09 NOTE — Telephone Encounter (Signed)
Called pt and lvm asking her to call back with the bp reading that was written down on her AVS. .Shari PacasBarkley, Gilbert Manolis OstranderLynetta

## 2017-04-12 ENCOUNTER — Encounter: Payer: Self-pay | Admitting: Family Medicine

## 2017-04-23 ENCOUNTER — Ambulatory Visit: Payer: BC Managed Care – PPO

## 2017-05-07 ENCOUNTER — Other Ambulatory Visit: Payer: Self-pay | Admitting: Family Medicine

## 2017-07-28 ENCOUNTER — Ambulatory Visit: Payer: BC Managed Care – PPO | Admitting: Physician Assistant

## 2017-07-28 ENCOUNTER — Encounter: Payer: Self-pay | Admitting: Physician Assistant

## 2017-07-28 VITALS — BP 137/80 | HR 88 | Temp 97.9°F | Wt 225.0 lb

## 2017-07-28 DIAGNOSIS — J029 Acute pharyngitis, unspecified: Secondary | ICD-10-CM

## 2017-07-28 LAB — POCT RAPID STREP A (OFFICE): RAPID STREP A SCREEN: NEGATIVE

## 2017-07-28 NOTE — Progress Notes (Signed)
HPI:                                                                [name] is a 42 y.o. female who presents to [facility] today for sore throat  This is a pleasant 42 yo F with 4 days of sore throat, clear rhinorrhea, congestion, post-nasal drip, and globus sensation. Sore throat is worse at night. She was recently treated with Augmentin for bacterial sinusitis on 07/11/17. She denies fever, chills, facial pressure, cough. She was prescribed Flonase but has an aversion to putting anything up her nose.   Depression screen PHQ 2/9 04/07/2017  Decreased Interest 0  Down, Depressed, Hopeless 0  PHQ - 2 Score 0    No flowsheet data found.    Past Medical History:  Diagnosis Date  . Hypertension   . IBS (irritable bowel syndrome)   . PCOS (polycystic ovarian syndrome)    Past Surgical History:  Procedure Laterality Date  . CESAREAN SECTION  2008  . CESAREAN SECTION  02/02/2011   Procedure: CESAREAN SECTION;  Surgeon: Jeani Hawking, MD;  Location: [facility];  Service: Gynecology;  Laterality: N/A;  . ESSURE TUBAL LIGATION  03/26/11   Social History   Tobacco Use  . Smoking status: Never Smoker  . Smokeless tobacco: Never Used  Substance Use Topics  . Alcohol use: No   family history includes Cancer in her father; Diabetes in her mother; Heart attack in her paternal grandmother; Hyperlipidemia in her mother; Hypertension in her mother and unknown relative.    ROS: negative except as noted in the HPI  Medications: Current Outpatient Medications  Medication Sig Dispense Refill  . amLODipine (NORVASC) 10 MG tablet TAKE 1 TABLET BY MOUTH EVERY DAY 90 tablet 1  . metFORMIN (GLUCOPHAGE-XR) 500 MG 24 hr tablet TAKE 1 TABLET (500 MG TOTAL) BY MOUTH DAILY WITH BREAKFAST. 90 tablet 1  . metoprolol succinate (TOPROL-XL) 25 MG 24 hr tablet Take 1 tablet (25 mg total) by mouth daily. 90 tablet 1  . spironolactone (ALDACTONE) 25 MG  tablet TAKE 1 TABLET (25 MG TOTAL) BY MOUTH DAILY. 90 tablet 1   No current facility-administered medications for this visit.    No Known Allergies     Objective:  BP 137/80   Pulse 88   Temp 97.9 F (36.6 C) (Oral)   Wt 225 lb (102.1 kg)   BMI 43.94 kg/m  Gen:  alert, not ill-appearing, no distress, appropriate for age HEENT: head normocephalic without obvious abnormality, conjunctiva and cornea clear, nasal mucosa edematous, oropharynx with erythema, no edema or exudates, uvula midline, neck supple, no cervical adenopathy, trachea midline Pulm: Normal work of breathing, normal phonation, clear to auscultation bilaterally, no wheezes, rales or rhonchi CV: Normal rate, regular rhythm, s1 and s2 distinct, no murmurs, clicks or rubs  Neuro: alert and oriented x 3, no tremor MSK: extremities atraumatic, normal gait and station Skin: intact, no rashes on exposed skin, no jaundice, no cyanosis   No results found for this or any previous visit (from the past 72 hour(s)). No results found.    Assessment and Plan: 42 y.o. female with   Acute pharyngitis, unspecified etiology - Plan: POCT rapid strep A -  rapid Strep negative, Centor score 1, very low likelihood. I think throat is irritated by post-nasal secretions. Encouraged her to try dousing a Qtip with Flonase and gently rotating it inside her nostril. Explained she can try an oral decongestant and antihistamine, but these are inferior treatments to intranasal steroid. Encouraged humidified air, salt water gargles, Tylenol/Ibuprofen prn for pain  Patient education and anticipatory guidance given Patient agrees with treatment plan Follow-up as needed if symptoms worsen or fail to improve  Levonne Hubertharley E. Valerie Cones PA-C

## 2017-08-25 ENCOUNTER — Other Ambulatory Visit: Payer: Self-pay | Admitting: Family Medicine

## 2017-10-01 ENCOUNTER — Ambulatory Visit: Payer: BC Managed Care – PPO | Admitting: Family Medicine

## 2017-10-01 ENCOUNTER — Encounter: Payer: Self-pay | Admitting: Family Medicine

## 2017-10-01 VITALS — BP 134/84 | HR 82 | Ht 60.0 in | Wt 222.0 lb

## 2017-10-01 DIAGNOSIS — E282 Polycystic ovarian syndrome: Secondary | ICD-10-CM

## 2017-10-01 DIAGNOSIS — R7301 Impaired fasting glucose: Secondary | ICD-10-CM

## 2017-10-01 DIAGNOSIS — I10 Essential (primary) hypertension: Secondary | ICD-10-CM

## 2017-10-01 LAB — POCT GLYCOSYLATED HEMOGLOBIN (HGB A1C): HEMOGLOBIN A1C: 5.7

## 2017-10-01 NOTE — Progress Notes (Addendum)
Subjective:    CC: BP, glucose ck HPI:  Hypertension- Pt denies chest pain, SOB, dizziness, or heart palpitations.  Taking meds as directed w/o problems.  Denies medication side effects.    F/U PCOS/IFG - she is doing well overall.  She has been walking for 4-5 days per week for about 1.5 miles.  Her husband has been wwalking with her.  That she was able to lose about 10 pounds but did gain about 2 back. Lab Results  Component Value Date   HGBA1C 5.8 04/07/2017    Past medical history, Surgical history, Family history not pertinant except as noted below, Social history, Allergies, and medications have been entered into the medical record, reviewed, and corrections made.   Review of Systems: No fevers, chills, night sweats, weight loss, chest pain, or shortness of breath.   Objective:    General: Well Developed, well nourished, and in no acute distress.  Neuro: Alert and oriented x3, extra-ocular muscles intact, sensation grossly intact.  HEENT: Normocephalic, atraumatic  Skin: Warm and dry, no rashes. Cardiac: Regular rate and rhythm, no murmurs rubs or gallops, no lower extremity edema.  Respiratory: Clear to auscultation bilaterally. Not using accessory muscles, speaking in full sentences.   Impression and Recommendations:    HTN - Well controlled. Continue current regimen. Follow up in  6 months. Due for BMP.   PCOS/IFG  - Improved.  A!C of 5.7.  She is doing great!  Keep up the walking.

## 2017-10-02 LAB — BASIC METABOLIC PANEL WITH GFR
BUN: 10 mg/dL (ref 7–25)
CALCIUM: 10.2 mg/dL (ref 8.6–10.2)
CO2: 24 mmol/L (ref 20–32)
CREATININE: 0.71 mg/dL (ref 0.50–1.10)
Chloride: 102 mmol/L (ref 98–110)
GFR, EST NON AFRICAN AMERICAN: 107 mL/min/{1.73_m2} (ref >=60)
GFR, Est African American: 123 mL/min/{1.73_m2} (ref >=60)
Glucose, Bld: 104 mg/dL — ABNORMAL HIGH (ref 65–99)
Potassium: 4.1 mmol/L (ref 3.5–5.3)
SODIUM: 137 mmol/L (ref 135–146)

## 2017-10-04 NOTE — Progress Notes (Signed)
All labs are normal. 

## 2017-10-05 ENCOUNTER — Ambulatory Visit: Payer: BC Managed Care – PPO | Admitting: Family Medicine

## 2017-10-30 ENCOUNTER — Other Ambulatory Visit: Payer: Self-pay | Admitting: Family Medicine

## 2018-03-06 ENCOUNTER — Other Ambulatory Visit: Payer: Self-pay | Admitting: Family Medicine

## 2018-04-11 ENCOUNTER — Ambulatory Visit: Payer: BC Managed Care – PPO | Admitting: Family Medicine

## 2018-04-12 ENCOUNTER — Other Ambulatory Visit: Payer: Self-pay | Admitting: Family Medicine

## 2018-04-12 DIAGNOSIS — Z1231 Encounter for screening mammogram for malignant neoplasm of breast: Secondary | ICD-10-CM

## 2018-04-13 ENCOUNTER — Ambulatory Visit (INDEPENDENT_AMBULATORY_CARE_PROVIDER_SITE_OTHER): Payer: BC Managed Care – PPO | Admitting: Family Medicine

## 2018-04-13 ENCOUNTER — Ambulatory Visit (INDEPENDENT_AMBULATORY_CARE_PROVIDER_SITE_OTHER): Payer: BC Managed Care – PPO

## 2018-04-13 ENCOUNTER — Encounter: Payer: Self-pay | Admitting: Family Medicine

## 2018-04-13 VITALS — BP 132/78 | HR 70 | Ht 59.45 in | Wt 228.0 lb

## 2018-04-13 DIAGNOSIS — M722 Plantar fascial fibromatosis: Secondary | ICD-10-CM

## 2018-04-13 DIAGNOSIS — R7301 Impaired fasting glucose: Secondary | ICD-10-CM

## 2018-04-13 DIAGNOSIS — Z1231 Encounter for screening mammogram for malignant neoplasm of breast: Secondary | ICD-10-CM

## 2018-04-13 DIAGNOSIS — Z0000 Encounter for general adult medical examination without abnormal findings: Secondary | ICD-10-CM | POA: Diagnosis not present

## 2018-04-13 DIAGNOSIS — Z23 Encounter for immunization: Secondary | ICD-10-CM | POA: Diagnosis not present

## 2018-04-13 LAB — COMPLETE METABOLIC PANEL WITH GFR
AG RATIO: 1.5 (calc) (ref 1.0–2.5)
ALT: 18 U/L (ref 6–29)
AST: 20 U/L (ref 10–30)
Albumin: 4.1 g/dL (ref 3.6–5.1)
Alkaline phosphatase (APISO): 51 U/L (ref 33–115)
BUN: 11 mg/dL (ref 7–25)
CALCIUM: 9.4 mg/dL (ref 8.6–10.2)
CO2: 26 mmol/L (ref 20–32)
Chloride: 101 mmol/L (ref 98–110)
Creat: 0.67 mg/dL (ref 0.50–1.10)
GFR, Est African American: 127 mL/min/{1.73_m2} (ref >=60)
GFR, Est Non African American: 109 mL/min/{1.73_m2} (ref >=60)
Globulin: 2.8 g/dL (calc) (ref 1.9–3.7)
Glucose, Bld: 86 mg/dL (ref 65–99)
POTASSIUM: 3.9 mmol/L (ref 3.5–5.3)
Sodium: 138 mmol/L (ref 135–146)
Total Bilirubin: 0.6 mg/dL (ref 0.2–1.2)
Total Protein: 6.9 g/dL (ref 6.1–8.1)

## 2018-04-13 LAB — LIPID PANEL
Cholesterol: 219 mg/dL — ABNORMAL HIGH (ref <200)
HDL: 48 mg/dL — AB (ref >50)
LDL Cholesterol (Calc): 140 mg/dL (calc) — ABNORMAL HIGH
NON-HDL CHOLESTEROL (CALC): 171 mg/dL — AB (ref <130)
TRIGLYCERIDES: 177 mg/dL — AB (ref <150)
Total CHOL/HDL Ratio: 4.6 (calc) (ref <5.0)

## 2018-04-13 LAB — CBC
HCT: 42.8 % (ref 35.0–45.0)
HEMOGLOBIN: 14.5 g/dL (ref 11.7–15.5)
MCH: 28.9 pg (ref 27.0–33.0)
MCHC: 33.9 g/dL (ref 32.0–36.0)
MCV: 85.3 fL (ref 80.0–100.0)
MPV: 10.9 fL (ref 7.5–12.5)
Platelets: 260 10*3/uL (ref 140–400)
RBC: 5.02 10*6/uL (ref 3.80–5.10)
RDW: 13.4 % (ref 11.0–15.0)
WBC: 6.4 10*3/uL (ref 3.8–10.8)

## 2018-04-13 LAB — POCT GLYCOSYLATED HEMOGLOBIN (HGB A1C): HEMOGLOBIN A1C: 5.6 % (ref 4.0–5.6)

## 2018-04-13 NOTE — Progress Notes (Signed)
Subjective:     [name] is a 42 y.o. female and is here for a comprehensive physical exam. The patient reports problems - right heel pain.  She had some right heel pain on and off for quite some time but after they went to [facility] in June and did a lot of walking for at least 8 hours a day it really aggravated it.  She is had pain and soreness since then.  It is worse when she first gets up in the morning and puts pressure on it and if she is been sitting for a while and then stands up and puts pressure on it.  Also if she walks a lot it gets aggravated.  She has tried some ibuprofen here there.  Sometimes she feels like it helps but sometimes she feels like it does not help.  No known trauma or injury.  She is not currently exercising regularly.    No recent chest pain, shortness of breath, or bowel changes.  Social History   Socioeconomic History  . Marital status: Married    Spouse name: Not on file  . Number of children: 2  . Years of education: Not on file  . Highest education level: Not on file  Occupational History  . Occupation: LIB    Employer: [employer]  Social Needs  . Financial resource strain: Not on file  . Food insecurity:    Worry: Not on file    Inability: Not on file  . Transportation needs:    Medical: Not on file    Non-medical: Not on file  Tobacco Use  . Smoking status: Never Smoker  . Smokeless tobacco: Never Used  Substance and Sexual Activity  . Alcohol use: No  . Drug use: No  . Sexual activity: Yes    Partners: Male  Lifestyle  . Physical activity:    Days per week: Not on file    Minutes per session: Not on file  . Stress: Not on file  Relationships  . Social connections:    Talks on phone: Not on file    Gets together: Not on file    Attends religious service: Not on file    Active member of club or organization: Not on file    Attends meetings of clubs or organizations: Not on file    Relationship status: Not on file  .  Intimate partner violence:    Fear of current or ex partner: Not on file    Emotionally abused: Not on file    Physically abused: Not on file    Forced sexual activity: Not on file  Other Topics Concern  . Not on file  Social History Narrative   No regular exercise.          Health Maintenance  Topic Date Due  . PAP SMEAR  03/31/2020  . TETANUS/TDAP  04/13/2028  . INFLUENZA VACCINE  Completed  . HIV Screening  Completed    The following portions of the patient's history were reviewed and updated as appropriate: allergies, current medications, past family history, past medical history, past social history, past surgical history and problem list.  Review of Systems A comprehensive review of systems was negative.   Objective:    BP 132/78   Pulse 70   Ht 4' 11.45" (1.51 m)   Wt 228 lb (103.4 kg)   SpO2 100%   BMI 45.36 kg/m  General appearance: alert, cooperative and appears stated age Head: Normocephalic,  without obvious abnormality, atraumatic Eyes: conj clear, EOMI, PEERLA Ears: normal TM's and external ear canals both ears Nose: Nares normal. Septum midline. Mucosa normal. No drainage or sinus tenderness. Throat: lips, mucosa, and tongue normal; teeth and gums normal Neck: no adenopathy, no carotid bruit, no JVD, supple, symmetrical, trachea midline and thyroid not enlarged, symmetric, no tenderness/mass/nodules Back: symmetric, no curvature. ROM normal. No CVA tenderness. Lungs: clear to auscultation bilaterally Breasts: normal appearance, no masses or tenderness Heart: regular rate and rhythm, S1, S2 normal, no murmur, click, rub or gallop Abdomen: soft, non-tender; bowel sounds normal; no masses,  no organomegaly Extremities: extremities normal, atraumatic, no cyanosis or edema Pulses: 2+ and symmetric Skin: Skin color, texture, turgor normal. No rashes or lesions Lymph nodes: Cervical, supraclavicular, and axillary nodes normal. Neurologic: Alert and oriented X  3, normal strength and tone. Normal symmetric reflexes. Normal coordination and gait    Assessment:    Healthy female exam.      Plan:     See After Visit Summary for Counseling Recommendations   Keep up a regular exercise program and make sure you are eating a healthy diet Try to eat 4 servings of dairy a day, or if you are lactose intolerant take a calcium with vitamin D daily.  Your vaccines are up to date.  Flu vaccine and tetanus administered today.  Impaired fasting glucose-hemoglobin A1c improved today at 5.6 which is great.  Plantar fascitis -right heel.  Discussed diagnosis and treatment.  Recommend home stretches icing and anti-inflammatory.  Also can consider nighttime foot splints.  If not improving over the next 4-6 weeks then consider getting an with 1 of our sports med docs for possible injection.

## 2018-04-13 NOTE — Patient Instructions (Signed)

## 2018-04-25 ENCOUNTER — Other Ambulatory Visit: Payer: Self-pay | Admitting: Family Medicine

## 2018-10-11 ENCOUNTER — Ambulatory Visit (INDEPENDENT_AMBULATORY_CARE_PROVIDER_SITE_OTHER): Payer: BC Managed Care – PPO | Admitting: Family Medicine

## 2018-10-11 ENCOUNTER — Encounter: Payer: Self-pay | Admitting: Family Medicine

## 2018-10-11 VITALS — Temp 97.5°F | Wt 232.0 lb

## 2018-10-11 DIAGNOSIS — I10 Essential (primary) hypertension: Secondary | ICD-10-CM

## 2018-10-11 DIAGNOSIS — E282 Polycystic ovarian syndrome: Secondary | ICD-10-CM | POA: Diagnosis not present

## 2018-10-11 DIAGNOSIS — R7301 Impaired fasting glucose: Secondary | ICD-10-CM

## 2018-10-11 NOTE — Progress Notes (Signed)
Virtual Visit via Video Note  I connected with [name] on 10/11/18 at  4:20 PM EDT by a video enabled telemedicine application and verified that I am speaking with the correct person using two identifiers.   I discussed the limitations of evaluation and management by telemedicine and the availability of in person appointments. The patient expressed understanding and agreed to proceed.  Pt was at work and I was in my home office for the visit.   Subjective:    CC: BP check   HPI:  Hypertension- Pt denies chest pain, SOB, dizziness, or heart palpitations.  Taking meds as directed w/o problems.  Denies medication side effects.    Impaired fasting glucose-no increased thirst or urination. No symptoms consistent with hypoglycemia.    Past medical history, Surgical history, Family history not pertinant except as noted below, Social history, Allergies, and medications have been entered into the medical record, reviewed, and corrections made.   Review of Systems: No fevers, chills, night sweats, weight loss, chest pain, or shortness of breath.   Objective:    General: Speaking clearly in complete sentences without any shortness of breath.  Alert and oriented x3.  Normal judgment. No apparent acute distress. Well groomed.    Impression and Recommendations:    HTN - Well controlled. Continue current regimen. Follow up in  6 months. Due for BMP since on diuretic.    IFG - due for repeat A1C. Continue to work on diet and exercise.   Discussed trying to get into a routine with exercise.    PCOS - on metformin.  she tolerated it well. Occ upsets her stomach and she will just hold it for a few days.    I discussed the assessment and treatment plan with the patient. The patient was provided an opportunity to ask questions and all were answered. The patient agreed with the plan and demonstrated an understanding of the instructions.   The patient was advised to call back or seek an  in-person evaluation if the symptoms worsen or if the condition fails to improve as anticipated.   Nani Gasser, MD

## 2018-10-11 NOTE — Progress Notes (Signed)
Pt will send her BP reading in on Friday.Heath Gold, CMA

## 2018-10-14 ENCOUNTER — Ambulatory Visit: Payer: BC Managed Care – PPO | Admitting: Family Medicine

## 2018-10-14 ENCOUNTER — Encounter: Payer: Self-pay | Admitting: Family Medicine

## 2018-10-15 LAB — BASIC METABOLIC PANEL WITH GFR
BUN: 10 mg/dL (ref 7–25)
CO2: 28 mmol/L (ref 20–32)
Calcium: 9.9 mg/dL (ref 8.6–10.2)
Chloride: 100 mmol/L (ref 98–110)
Creat: 0.68 mg/dL (ref 0.50–1.10)
GFR, Est African American: 126 mL/min/{1.73_m2} (ref >=60)
GFR, Est Non African American: 109 mL/min/{1.73_m2} (ref >=60)
Glucose, Bld: 93 mg/dL (ref 65–99)
Potassium: 4.1 mmol/L (ref 3.5–5.3)
Sodium: 138 mmol/L (ref 135–146)

## 2018-10-15 LAB — HEMOGLOBIN A1C
Hgb A1c MFr Bld: 5.6 % of total Hgb (ref <5.7)
Mean Plasma Glucose: 114 (calc)
eAG (mmol/L): 6.3 (calc)

## 2018-10-16 ENCOUNTER — Other Ambulatory Visit: Payer: Self-pay | Admitting: Family Medicine

## 2018-10-17 NOTE — Progress Notes (Signed)
All labs are normal. 

## 2018-10-23 ENCOUNTER — Other Ambulatory Visit: Payer: Self-pay | Admitting: Family Medicine

## 2018-10-31 ENCOUNTER — Encounter: Payer: Self-pay | Admitting: Family Medicine

## 2018-11-01 MED ORDER — METOPROLOL SUCCINATE ER 50 MG PO TB24: 50.0000 mg | ORAL_TABLET | Freq: Every day | ORAL | 1 refills | Status: DC

## 2018-11-01 NOTE — Addendum Note (Signed)
Addended by: Beatrice Lecher D on: 11/01/2018 12:38 PM   Modules accepted: Orders

## 2019-02-13 ENCOUNTER — Encounter: Payer: Self-pay | Admitting: Physician Assistant

## 2019-02-13 ENCOUNTER — Ambulatory Visit (INDEPENDENT_AMBULATORY_CARE_PROVIDER_SITE_OTHER): Payer: BC Managed Care – PPO | Admitting: Physician Assistant

## 2019-02-13 ENCOUNTER — Other Ambulatory Visit: Payer: Self-pay

## 2019-02-13 VITALS — BP 149/95 | HR 75 | Temp 98.5°F | Ht 60.0 in | Wt 224.0 lb

## 2019-02-13 DIAGNOSIS — R300 Dysuria: Secondary | ICD-10-CM | POA: Diagnosis not present

## 2019-02-13 DIAGNOSIS — N898 Other specified noninflammatory disorders of vagina: Secondary | ICD-10-CM | POA: Diagnosis not present

## 2019-02-13 DIAGNOSIS — N3001 Acute cystitis with hematuria: Secondary | ICD-10-CM

## 2019-02-13 LAB — POCT URINALYSIS DIPSTICK
Glucose, UA: NEGATIVE
Leukocytes, UA: NEGATIVE
Nitrite, UA: NEGATIVE
Protein, UA: POSITIVE — AB
Spec Grav, UA: >=1.03 — AB (ref 1.010–1.025)
Urobilinogen, UA: 0.2 E.U./dL
pH, UA: 5.5 (ref 5.0–8.0)

## 2019-02-13 LAB — WET PREP FOR TRICH, YEAST, CLUE
MICRO NUMBER:: 903297
Specimen Quality: ADEQUATE

## 2019-02-13 MED ORDER — NITROFURANTOIN MONOHYD MACRO 100 MG PO CAPS: 100.0000 mg | ORAL_CAPSULE | Freq: Two times a day (BID) | ORAL | 0 refills | Status: DC

## 2019-02-13 NOTE — Patient Instructions (Signed)
Urinary Tract Infection, Adult A urinary tract infection (UTI) is an infection of any part of the urinary tract. The urinary tract includes:  The kidneys.  The ureters.  The bladder.  The urethra. These organs make, store, and get rid of pee (urine) in the body. What are the causes? This is caused by germs (bacteria) in your genital area. These germs grow and cause swelling (inflammation) of your urinary tract. What increases the risk? You are more likely to develop this condition if:  You have a small, thin tube (catheter) to drain pee.  You cannot control when you pee or poop (incontinence).  You are female, and: ? You use these methods to prevent pregnancy: ? A medicine that kills sperm (spermicide). ? A device that blocks sperm (diaphragm). ? You have low levels of a female hormone (estrogen). ? You are pregnant.  You have genes that add to your risk.  You are sexually active.  You take antibiotic medicines.  You have trouble peeing because of: ? A prostate that is bigger than normal, if you are female. ? A blockage in the part of your body that drains pee from the bladder (urethra). ? A kidney stone. ? A nerve condition that affects your bladder (neurogenic bladder). ? Not getting enough to drink. ? Not peeing often enough.  You have other conditions, such as: ? Diabetes. ? A weak disease-fighting system (immune system). ? Sickle cell disease. ? Gout. ? Injury of the spine. What are the signs or symptoms? Symptoms of this condition include:  Needing to pee right away (urgently).  Peeing often.  Peeing small amounts often.  Pain or burning when peeing.  Blood in the pee.  Pee that smells bad or not like normal.  Trouble peeing.  Pee that is cloudy.  Fluid coming from the vagina, if you are female.  Pain in the belly or lower back. Other symptoms include:  Throwing up (vomiting).  No urge to eat.  Feeling mixed up (confused).  Being tired  and grouchy (irritable).  A fever.  Watery poop (diarrhea). How is this treated? This condition may be treated with:  Antibiotic medicine.  Other medicines.  Drinking enough water. Follow these instructions at home:  Medicines  Take over-the-counter and prescription medicines only as told by your doctor.  If you were prescribed an antibiotic medicine, take it as told by your doctor. Do not stop taking it even if you start to feel better. General instructions  Make sure you: ? Pee until your bladder is empty. ? Do not hold pee for a long time. ? Empty your bladder after sex. ? Wipe from front to back after pooping if you are a female. Use each tissue one time when you wipe.  Drink enough fluid to keep your pee pale yellow.  Keep all follow-up visits as told by your doctor. This is important. Contact a doctor if:  You do not get better after 1-2 days.  Your symptoms go away and then come back. Get help right away if:  You have very bad back pain.  You have very bad pain in your lower belly.  You have a fever.  You are sick to your stomach (nauseous).  You are throwing up. Summary  A urinary tract infection (UTI) is an infection of any part of the urinary tract.  This condition is caused by germs in your genital area.  There are many risk factors for a UTI. These include having a small, thin   tube to drain pee and not being able to control when you pee or poop.  Treatment includes antibiotic medicines for germs.  Drink enough fluid to keep your pee pale yellow. This information is not intended to replace advice given to you by your health care provider. Make sure you discuss any questions you have with your health care provider. Document Released: 10/28/2007 Document Revised: 04/28/2018 Document Reviewed: 11/18/2017 Elsevier Patient Education  2020 Elsevier Inc.  

## 2019-02-13 NOTE — Progress Notes (Signed)
Subjective:    Patient ID: [name], female    DOB: 07/27/1975, 43 y.o.   MRN: [id number]  HPI  Pt is a 43 yo female with 4 days of urinary pressure, frequency, dysuria. She has not tried anything to make better. She does not have a hx of UTI. It has been a long time since she has had one. She does have some scant brownish discharge. Pt denies any itching but does have some vulva irritation. Pt denies any fever, chills, nausea, flank pain or abdominal pain.  Pt denies any pain with intercourse. She has only husband of 59 years as partner. She does admit to some diarrhea intermittently from IBS.   .. Active Ambulatory Problems    Diagnosis Date Noted  . POLYCYSTIC OVARIAN DISEASE 03/26/2008  . Hyperlipidemia 03/26/2008  . COMMON MIGRAINE 03/26/2008  . IBS 03/26/2008  . S/P cesarean section 02/02/2011  . Essential hypertension 06/24/2015  . Family history of pancreatic cancer 12/05/2015  . IFG (impaired fasting glucose) 10/01/2017   Resolved Ambulatory Problems    Diagnosis Date Noted  . HYPERTENSION, BENIGN 03/29/2008  . SINUSITIS, ACUTE 06/28/2009  . Routine general medical examination at a health care facility 04/06/2016   Past Medical History:  Diagnosis Date  . Hypertension   . IBS (irritable bowel syndrome)   . PCOS (polycystic ovarian syndrome)        Review of Systems See HPI.     Objective:   Physical Exam Vitals signs reviewed.  Constitutional:      Appearance: Normal appearance.  HENT:     Head: Normocephalic.  Cardiovascular:     Rate and Rhythm: Normal rate and regular rhythm.     Pulses: Normal pulses.  Pulmonary:     Effort: Pulmonary effort is normal.     Breath sounds: Normal breath sounds.  Abdominal:     General: There is no distension.     Palpations: Abdomen is soft.     Tenderness: There is abdominal tenderness. There is no right CVA tenderness, left CVA tenderness, guarding or rebound.     Comments: Suprapubic tenderness.    Neurological:     General: No focal deficit present.     Mental Status: She is alert.  Psychiatric:        Mood and Affect: Mood normal.       .. Results for orders placed or performed in visit on 02/13/19  POCT urinalysis dipstick  Result Value Ref Range   Color, UA yellow    Clarity, UA clear    Glucose, UA Negative Negative   Bilirubin, UA small    Ketones, UA trace    Spec Grav, UA >=1.030 (A) 1.010 - 1.025   Blood, UA large    pH, UA 5.5 5.0 - 8.0   Protein, UA Positive (A) Negative   Urobilinogen, UA 0.2 0.2 or 1.0 E.U./dL   Nitrite, UA negative    Leukocytes, UA Negative Negative   Appearance     Odor         Assessment & Plan:  Marland KitchenMarland KitchenSki was seen today for dysuria.  Diagnoses and all orders for this visit:  Acute cystitis with hematuria -     nitrofurantoin, macrocrystal-monohydrate, (MACROBID) 100 MG capsule; Take 1 capsule (100 mg total) by mouth 2 (two) times daily.  Dysuria -     POCT urinalysis dipstick -     Urine Culture  Vaginal discharge -     WET PREP FOR TRICH, YEAST,  CLUE   UA positive for protein and blood. Will get culture. Due to symptoms will treat empirically with macrobid. Will get wet prep due to vaginal irritation. HO given. Increase hydration. Follow up as needed.

## 2019-02-13 NOTE — Progress Notes (Signed)
No yeast, trich, clue cells seen. Will wait for culture.

## 2019-02-15 ENCOUNTER — Encounter: Payer: Self-pay | Admitting: Physician Assistant

## 2019-02-15 LAB — URINE CULTURE
MICRO NUMBER:: 906129
SPECIMEN QUALITY:: ADEQUATE

## 2019-02-15 NOTE — Progress Notes (Signed)
[name],   Urine culture showed mixed flora not suggestive of any one bacteria. Do you feel better after antibiotic? This culture is not indicative of infection and could stop antibiotic. Symptoms could have been more due to inflammation. Make sure drinking plenty of water. Let me know if symptoms not improving or worsening.   -Luvenia Starch

## 2019-04-17 ENCOUNTER — Other Ambulatory Visit (HOSPITAL_BASED_OUTPATIENT_CLINIC_OR_DEPARTMENT_OTHER): Payer: Self-pay | Admitting: Family Medicine

## 2019-04-17 DIAGNOSIS — Z1231 Encounter for screening mammogram for malignant neoplasm of breast: Secondary | ICD-10-CM

## 2019-04-19 ENCOUNTER — Ambulatory Visit (INDEPENDENT_AMBULATORY_CARE_PROVIDER_SITE_OTHER): Payer: BC Managed Care – PPO

## 2019-04-19 ENCOUNTER — Telehealth: Payer: Self-pay | Admitting: Family Medicine

## 2019-04-19 ENCOUNTER — Ambulatory Visit (INDEPENDENT_AMBULATORY_CARE_PROVIDER_SITE_OTHER): Payer: BC Managed Care – PPO | Admitting: Family Medicine

## 2019-04-19 ENCOUNTER — Encounter: Payer: Self-pay | Admitting: Family Medicine

## 2019-04-19 ENCOUNTER — Other Ambulatory Visit: Payer: Self-pay

## 2019-04-19 VITALS — BP 139/75 | HR 66 | Temp 98.6°F | Ht 60.0 in | Wt 222.0 lb

## 2019-04-19 DIAGNOSIS — Z1231 Encounter for screening mammogram for malignant neoplasm of breast: Secondary | ICD-10-CM | POA: Diagnosis not present

## 2019-04-19 DIAGNOSIS — Z0000 Encounter for general adult medical examination without abnormal findings: Secondary | ICD-10-CM

## 2019-04-19 MED ORDER — AMLODIPINE BESYLATE 10 MG PO TABS: 10.0000 mg | ORAL_TABLET | Freq: Every day | ORAL | 1 refills | Status: DC

## 2019-04-19 NOTE — Patient Instructions (Signed)

## 2019-04-19 NOTE — Telephone Encounter (Signed)
Please call [facility] and see if they think patient might be a candidate for testing.  She had a father who was diagnosed at 19 with pancreatic cancer and a paternal grandfather who was diagnosed with liver and kidney cancer in his early 80s.  Not sure if this test would be helpful for her or not.
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
## 2019-04-20 LAB — LIPID PANEL W/REFLEX DIRECT LDL
Cholesterol: 193 mg/dL (ref <200)
HDL: 51 mg/dL (ref >=50)
LDL Cholesterol (Calc): 112 mg/dL (calc) — ABNORMAL HIGH
Non-HDL Cholesterol (Calc): 142 mg/dL (calc) — ABNORMAL HIGH (ref <130)
Total CHOL/HDL Ratio: 3.8 (calc) (ref <5.0)
Triglycerides: 178 mg/dL — ABNORMAL HIGH (ref <150)

## 2019-04-20 LAB — COMPLETE METABOLIC PANEL WITH GFR
AG Ratio: 1.7 (calc) (ref 1.0–2.5)
ALT: 26 U/L (ref 6–29)
AST: 19 U/L (ref 10–30)
Albumin: 4.4 g/dL (ref 3.6–5.1)
Alkaline phosphatase (APISO): 57 U/L (ref 31–125)
BUN: 11 mg/dL (ref 7–25)
CO2: 24 mmol/L (ref 20–32)
Calcium: 9.8 mg/dL (ref 8.6–10.2)
Chloride: 99 mmol/L (ref 98–110)
Creat: 0.68 mg/dL (ref 0.50–1.10)
GFR, Est African American: 125 mL/min/{1.73_m2} (ref >=60)
GFR, Est Non African American: 108 mL/min/{1.73_m2} (ref >=60)
Globulin: 2.6 g/dL (calc) (ref 1.9–3.7)
Glucose, Bld: 91 mg/dL (ref 65–99)
Potassium: 4.2 mmol/L (ref 3.5–5.3)
Sodium: 137 mmol/L (ref 135–146)
Total Bilirubin: 0.8 mg/dL (ref 0.2–1.2)
Total Protein: 7 g/dL (ref 6.1–8.1)

## 2019-04-20 LAB — CBC
HCT: 44.2 % (ref 35.0–45.0)
Hemoglobin: 14.9 g/dL (ref 11.7–15.5)
MCH: 29.4 pg (ref 27.0–33.0)
MCHC: 33.7 g/dL (ref 32.0–36.0)
MCV: 87.4 fL (ref 80.0–100.0)
MPV: 11.4 fL (ref 7.5–12.5)
Platelets: 246 10*3/uL (ref 140–400)
RBC: 5.06 10*6/uL (ref 3.80–5.10)
RDW: 13.1 % (ref 11.0–15.0)
WBC: 7.6 10*3/uL (ref 3.8–10.8)

## 2019-04-20 LAB — HEMOGLOBIN A1C
Hgb A1c MFr Bld: 5.5 % of total Hgb (ref <5.7)
Mean Plasma Glucose: 111 (calc)
eAG (mmol/L): 6.2 (calc)

## 2019-04-24 NOTE — Telephone Encounter (Signed)
Called and informed pt of recommendations. Will contact [facility] for advice on testing.Elouise Munroe, [place]

## 2019-04-26 NOTE — Telephone Encounter (Signed)
Please call patient and let her know that we did call.  Genetics and they did recommend gene testing since she does have a first-degree relative with pancreatic cancer.  She can always come in for an appointment and we can draw the blood and fill out the paperwork.  They usually will check with the insurance first to see if it is covered and if its not then they will let you know and then you can decide if you want to have it run or not.

## 2019-04-26 NOTE — Telephone Encounter (Signed)
Called [facility] and was informed that since she has a 1 degree relative with cancer the BRCA 1&2 testing would be able to pick up the pancreatic cancer gene and this is the most common testing.  The my risk 57 will test for several but not for liver or kidney cancer gene.   The type of test that you order will depend on what the patient or physician is worried about. [name], Lahoma Crocker, CMA

## 2019-04-26 NOTE — Telephone Encounter (Signed)
Have we heard back from [facility]?

## 2019-04-26 NOTE — Telephone Encounter (Signed)
lvm advising pt of recommendations and advised that she either call back or send a my chart about how she would like to proceed.Maryruth Eve, Lahoma Crocker, CMA

## 2019-04-30 ENCOUNTER — Other Ambulatory Visit: Payer: Self-pay | Admitting: Family Medicine

## 2019-05-09 ENCOUNTER — Other Ambulatory Visit: Payer: Self-pay | Admitting: Family Medicine

## 2019-05-17 ENCOUNTER — Other Ambulatory Visit: Payer: Self-pay

## 2019-05-17 ENCOUNTER — Ambulatory Visit (INDEPENDENT_AMBULATORY_CARE_PROVIDER_SITE_OTHER): Payer: BC Managed Care – PPO | Admitting: Sports Medicine

## 2019-05-17 ENCOUNTER — Encounter: Payer: Self-pay | Admitting: Family Medicine

## 2019-05-17 DIAGNOSIS — Z800 Family history of malignant neoplasm of digestive organs: Secondary | ICD-10-CM

## 2019-05-17 NOTE — Progress Notes (Signed)
Pt in today for blood draw for [facility]. Forms completed and specimen labeled and sent by [employer]. Copy of forms copied and sent to be scanned.

## 2019-06-14 ENCOUNTER — Telehealth: Payer: Self-pay | Admitting: *Deleted

## 2019-06-14 NOTE — Telephone Encounter (Signed)
Pt was in a meeting and will RTC when done.   She can schedule this visit as a virtual..Mallory Enriques, Viann Shove, CMA

## 2019-06-27 ENCOUNTER — Telehealth (INDEPENDENT_AMBULATORY_CARE_PROVIDER_SITE_OTHER): Payer: BC Managed Care – PPO | Admitting: Family Medicine

## 2019-06-27 ENCOUNTER — Encounter: Payer: Self-pay | Admitting: Family Medicine

## 2019-06-27 VITALS — Ht 60.0 in | Wt 222.0 lb

## 2019-06-27 DIAGNOSIS — Z1379 Encounter for other screening for genetic and chromosomal anomalies: Secondary | ICD-10-CM

## 2019-06-27 NOTE — Progress Notes (Signed)
Virtual Visit via Video Note  I connected with [name] on 06/28/19 at  3:20 PM EST by a video enabled telemedicine application and verified that I am speaking with the correct person using two identifiers.   I discussed the limitations of evaluation and management by telemedicine and the availability of in person appointments. The patient expressed understanding and agreed to proceed.  Subjective:    CC: Go over test results: [facility].   HPI: 44 year old female was recently here for complete physical and upon discussion of her family history recommended that she consider getting [facility] testing.  She is here today to go over those results together.  Her father died in his mid 103s from pancreatic cancer.  Past medical history, Surgical history, Family history not pertinant except as noted below, Social history, Allergies, and medications have been entered into the medical record, reviewed, and corrections made.   Review of Systems: No fevers, chills, night sweats, weight loss, chest pain, or shortness of breath.   Objective:    General: Speaking clearly in complete sentences without any shortness of breath.  Alert and oriented x3.  Normal judgment. No apparent acute distress.    Impression and Recommendations:   Genetic test results. She has her sister is with her.  Fortunately all her test results were negative.  And even her risk for breast cancer was very low.  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  I reviewed all these results with her and her sister and recommend that she consider more formal genetic counseling which is free through [facility].  Lee packet of information upfront where she can have a copy the results are as herself as well as information for the genetic counseling referral.  Also there is a form completed to see whether or not she might qualify for the APC  gene study which I would encourage her to complete.  Answered all of her questions she did not have any additional today.  Also discussed the importance of just continuing to have a healthy lifestyle including healthy diet, reducing stress levels, getting adequate sleep and regular exercise and maintaining a healthy BMI all to help reduce her risk of cancer.  She rarely consumes alcohol, and does not smoke.   Time spent 25 minutes in encounter.  I discussed the assessment and treatment plan with the patient. The patient was provided an opportunity to ask questions and all were answered. The patient agreed with the plan and demonstrated an understanding of the instructions.   The patient was advised to call back or seek an in-person evaluation if the symptoms worsen or if the condition fails to improve as anticipated.   Beatrice Lecher, MD

## 2019-08-31 ENCOUNTER — Other Ambulatory Visit: Payer: Self-pay | Admitting: Family Medicine

## 2019-10-01 ENCOUNTER — Other Ambulatory Visit: Payer: Self-pay | Admitting: Family Medicine

## 2019-10-20 ENCOUNTER — Ambulatory Visit (INDEPENDENT_AMBULATORY_CARE_PROVIDER_SITE_OTHER): Payer: BC Managed Care – PPO | Admitting: Family Medicine

## 2019-10-20 ENCOUNTER — Encounter: Payer: Self-pay | Admitting: Family Medicine

## 2019-10-20 VITALS — BP 138/98 | HR 80 | Ht 60.0 in | Wt 224.0 lb

## 2019-10-20 DIAGNOSIS — I10 Essential (primary) hypertension: Secondary | ICD-10-CM

## 2019-10-20 DIAGNOSIS — E282 Polycystic ovarian syndrome: Secondary | ICD-10-CM

## 2019-10-20 DIAGNOSIS — R7301 Impaired fasting glucose: Secondary | ICD-10-CM | POA: Diagnosis not present

## 2019-10-20 DIAGNOSIS — J302 Other seasonal allergic rhinitis: Secondary | ICD-10-CM | POA: Diagnosis not present

## 2019-10-20 LAB — POCT GLYCOSYLATED HEMOGLOBIN (HGB A1C): Hemoglobin A1C: 5.4 % (ref 4.0–5.6)

## 2019-10-20 LAB — BASIC METABOLIC PANEL WITH GFR
BUN: 10 mg/dL (ref 7–25)
CO2: 24 mmol/L (ref 20–32)
Calcium: 9.2 mg/dL (ref 8.6–10.2)
Chloride: 102 mmol/L (ref 98–110)
Creat: 0.73 mg/dL (ref 0.50–1.10)
GFR, Est African American: 118 mL/min/{1.73_m2} (ref >=60)
GFR, Est Non African American: 102 mL/min/{1.73_m2} (ref >=60)
Glucose, Bld: 85 mg/dL (ref 65–99)
Potassium: 3.9 mmol/L (ref 3.5–5.3)
Sodium: 137 mmol/L (ref 135–146)

## 2019-10-20 MED ORDER — SPIRONOLACTONE 25 MG PO TABS: ORAL_TABLET | ORAL | 1 refills | Status: DC

## 2019-10-20 NOTE — Progress Notes (Signed)
Established Patient Office Visit  Subjective:  Patient ID: [name], female    DOB: [birth year]  Age: 44 y.o. MRN: [id number]  CC:  Chief Complaint  Patient presents with  . Hypertension  . ifg    HPI [name] presents for   Hypertension- Pt denies chest pain, SOB, dizziness, or heart palpitations.  Taking meds as directed w/o problems.  Denies medication side effects.    Impaired fasting glucose-no increased thirst or urination. No symptoms consistent with hypoglycemia.  Seasonal allergies - just using Sudafed and mucinex.  Not on any allergy medication.     Past Medical History:  Diagnosis Date  . Hypertension   . IBS (irritable bowel syndrome)   . PCOS (polycystic ovarian syndrome)     Past Surgical History:  Procedure Laterality Date  . CESAREAN SECTION  2008  . CESAREAN SECTION  02/02/2011   Procedure: CESAREAN SECTION;  Surgeon: Jeani Hawking, MD;  Location: [facility];  Service: Gynecology;  Laterality: N/A;  . ESSURE TUBAL LIGATION  03/26/11    Family History  Problem Relation Age of Onset  . Hypertension Other   . Heart attack Paternal Grandmother   . Diabetes Mother   . Hypertension Mother   . Hyperlipidemia Mother   . Pancreatic cancer Father   . Cancer Paternal Grandfather        Kidney and liver    Social History   Socioeconomic History  . Marital status: Married    Spouse name: Not on file  . Number of children: 2  . Years of education: Not on file  . Highest education level: Not on file  Occupational History  . Occupation: LIB    Employer: [employer]  Tobacco Use  . Smoking status: Never Smoker  . Smokeless tobacco: Never Used  Substance and Sexual Activity  . Alcohol use: No  . Drug use: No  . Sexual activity: Yes    Partners: Male  Other Topics Concern  . Not on file  Social History Narrative   No regular exercise.          Social Determinants of Health   Financial Resource Strain:   . Difficulty of  Paying Living Expenses:   Food Insecurity:   . Worried About Programme researcher, broadcasting/film/video in the Last Year:   . Barista in the Last Year:   Transportation Needs:   . Freight forwarder (Medical):   Marland Kitchen Lack of Transportation (Non-Medical):   Physical Activity:   . Days of Exercise per Week:   . Minutes of Exercise per Session:   Stress:   . Feeling of Stress :   Social Connections:   . Frequency of Communication with Friends and Family:   . Frequency of Social Gatherings with Friends and Family:   . Attends Religious Services:   . Active Member of Clubs or Organizations:   . Attends Banker Meetings:   Marland Kitchen Marital Status:   Intimate Partner Violence:   . Fear of Current or Ex-Partner:   . Emotionally Abused:   Marland Kitchen Physically Abused:   . Sexually Abused:     Outpatient Medications Prior to Visit  Medication Sig Dispense Refill  . amLODipine (NORVASC) 10 MG tablet TAKE 1 TABLET BY MOUTH EVERY DAY 90 tablet 1  . metoprolol succinate (TOPROL-XL) 50 MG 24 hr tablet TAKE 1 TABLET BY MOUTH EVERY DAY 90 tablet 1  . metFORMIN (GLUCOPHAGE-XR) 500 MG 24 hr  tablet TAKE 1 TABLET (500 MG TOTAL) BY MOUTH DAILY WITH BREAKFAST. 90 tablet 1  . spironolactone (ALDACTONE) 25 MG tablet TAKE 1 TABLET (25 MG TOTAL) BY MOUTH DAILY. 90 tablet 1   No facility-administered medications prior to visit.    No Known Allergies  ROS Review of Systems    Objective:    Physical Exam  Constitutional: She is oriented to person, place, and time. She appears well-developed and well-nourished.  HENT:  Head: Normocephalic and atraumatic.  Cardiovascular: Normal rate, regular rhythm and normal heart sounds.  Pulmonary/Chest: Effort normal and breath sounds normal.  Neurological: She is alert and oriented to person, place, and time.  Skin: Skin is warm and dry.  Psychiatric: She has a normal mood and affect. Her behavior is normal.    BP (!) 138/98   Pulse 80   Ht 5' (1.524 m)   Wt 224 lb  (101.6 kg)   SpO2 99%   BMI 43.75 kg/m  Wt Readings from Last 3 Encounters:  10/20/19 224 lb (101.6 kg)  06/27/19 222 lb (100.7 kg)  04/19/19 222 lb (100.7 kg)     There are no preventive care reminders to display for this patient.  There are no preventive care reminders to display for this patient.  Lab Results  Component Value Date   TSH 1.345 04/01/2015   Lab Results  Component Value Date   WBC 7.6 04/19/2019   HGB 14.9 04/19/2019   HCT 44.2 04/19/2019   MCV 87.4 04/19/2019   PLT 246 04/19/2019   Lab Results  Component Value Date   NA 137 04/19/2019   K 4.2 04/19/2019   CO2 24 04/19/2019   GLUCOSE 91 04/19/2019   BUN 11 04/19/2019   CREATININE 0.68 04/19/2019   BILITOT 0.8 04/19/2019   ALKPHOS 56 04/06/2016   AST 19 04/19/2019   ALT 26 04/19/2019   PROT 7.0 04/19/2019   ALBUMIN 4.3 04/06/2016   CALCIUM 9.8 04/19/2019   Lab Results  Component Value Date   CHOL 193 04/19/2019   Lab Results  Component Value Date   HDL 51 04/19/2019   Lab Results  Component Value Date   LDLCALC 112 (H) 04/19/2019   Lab Results  Component Value Date   TRIG 178 (H) 04/19/2019   Lab Results  Component Value Date   CHOLHDL 3.8 04/19/2019   Lab Results  Component Value Date   HGBA1C 5.4 10/20/2019      Assessment & Plan:   Problem List Items Addressed This Visit      Cardiovascular and Mediastinum   Essential hypertension - Primary    Blood pressure not optimal today.  Similar blood pressure 6 months ago.  Discussed options including continuing work on diet and exercise.  She said she and her husband were actually doing intermittent fasting together and were doing well until this last month or 2 but they are actually planning on getting back on track so she like to have a couple more months to work on it.  She says she already eats a low-salt diet.  If blood pressure is still elevated at future visit then we do have room to go up on her metoprolol if needed.  Due  for BMP today.      Relevant Medications   spironolactone (ALDACTONE) 25 MG tablet   Other Relevant Orders   BASIC METABOLIC PANEL WITH GFR     Endocrine   POLYCYSTIC OVARIAN DISEASE   IFG (impaired fasting glucose)  Been off her Metformin x6 months.  Was hopeful to get that down on her own.      Relevant Orders   POCT glycosylated hemoglobin (Hb A1C) (Completed)   BASIC METABOLIC PANEL WITH GFR    Other Visit Diagnoses    Seasonal allergies          Seasonal allergies-recommend a trial of an oral antihistamine.  Try Allegra or Zyrtec.  If not helping then please let me know.  She really does not want use a nasal spray.  Meds ordered this encounter  Medications  . spironolactone (ALDACTONE) 25 MG tablet    Sig: TAKE 1 TABLET (25 MG TOTAL) BY MOUTH DAILY.    Dispense:  90 tablet    Refill:  1    Follow-up: Return in about 6 months (around 04/21/2020) for Hypertension and PCOS.    Nani Gasser, MD

## 2019-10-20 NOTE — Assessment & Plan Note (Signed)
Been off her Metformin x6 months.  Was hopeful to get that down on her own.

## 2019-10-20 NOTE — Assessment & Plan Note (Signed)
Blood pressure not optimal today.  Similar blood pressure 6 months ago.  Discussed options including continuing work on diet and exercise.  She said she and her husband were actually doing intermittent fasting together and were doing well until this last month or 2 but they are actually planning on getting back on track so she like to have a couple more months to work on it.  She says she already eats a low-salt diet.  If blood pressure is still elevated at future visit then we do have room to go up on her metoprolol if needed.  Due for BMP today.

## 2019-10-24 NOTE — Progress Notes (Signed)
All labs are normal. 

## 2019-12-22 ENCOUNTER — Ambulatory Visit (INDEPENDENT_AMBULATORY_CARE_PROVIDER_SITE_OTHER): Payer: BC Managed Care – PPO | Admitting: Family Medicine

## 2019-12-22 ENCOUNTER — Other Ambulatory Visit: Payer: Self-pay

## 2019-12-22 VITALS — BP 137/85 | HR 72 | Ht 60.0 in | Wt 229.0 lb

## 2019-12-22 DIAGNOSIS — R0683 Snoring: Secondary | ICD-10-CM

## 2019-12-22 DIAGNOSIS — I10 Essential (primary) hypertension: Secondary | ICD-10-CM

## 2019-12-22 MED ORDER — METOPROLOL SUCCINATE ER 100 MG PO TB24: 100.0000 mg | ORAL_TABLET | Freq: Every day | ORAL | 2 refills | Status: DC

## 2019-12-22 NOTE — Assessment & Plan Note (Signed)
HeadStop bang questionnaire score of 4 which is significant for a potential high risk of sleep apnea.  Recommend further evaluation we did discuss home study versus in lab study.  We will may have to see what her insurance will cover she is otherwise low risk and that she does not have any underlying pulmonary or cardiac conditions.

## 2019-12-22 NOTE — Assessment & Plan Note (Signed)
Pressure still borderline today.  We will repeat BP at the end of the visit just to verify but she says she is actually been getting pretty similar numbers at home.  Discussed options.  We will go ahead and increase metoprolol to 100 mg a day in addition continue to work on healthy diet and weight loss.  She admits she is gained a little bit of weight back this summer and they have been eating out a lot more.  She did feel better when she was weighing a little less.  I also like to screen her for secondary causes such as sleep apnea.  Renal function was normal.

## 2019-12-22 NOTE — Progress Notes (Signed)
Established Patient Office Visit  Subjective:  Patient ID: [name], female    DOB: [birth year]  Age: 44 y.o. MRN: [id number]  CC:  Chief Complaint  Patient presents with  . Hypertension    HPI [name] presents for   Hypertension- Pt denies chest pain, SOB, dizziness, or heart palpitations.  Taking meds as directed w/o problems.  Denies medication side effects.  + history of snoring. Never tested for OSA.  Reports that she mostly snores when she is lying on her back.  Does she she does report feeling tired during the day and she does have uncontrolled hypertension right now.  Her BMI is greater than 35 and her neck circumference is 41 cm which puts her at increased risk.  Impaired fasting glucose-no increased thirst or urination. No symptoms consistent with hypoglycemia.   Past Medical History:  Diagnosis Date  . Hypertension   . IBS (irritable bowel syndrome)   . PCOS (polycystic ovarian syndrome)     Past Surgical History:  Procedure Laterality Date  . CESAREAN SECTION  2008  . CESAREAN SECTION  02/02/2011   Procedure: CESAREAN SECTION;  Surgeon: Jeani Hawking, MD;  Location: [facility];  Service: Gynecology;  Laterality: N/A;  . ESSURE TUBAL LIGATION  03/26/11    Family History  Problem Relation Age of Onset  . Hypertension Other   . Heart attack Paternal Grandmother   . Diabetes Mother   . Hypertension Mother   . Hyperlipidemia Mother   . Pancreatic cancer Father   . Cancer Paternal Grandfather        Kidney and liver    Social History   Socioeconomic History  . Marital status: Married    Spouse name: Not on file  . Number of children: 2  . Years of education: Not on file  . Highest education level: Not on file  Occupational History  . Occupation: LIB    Employer: [employer]  Tobacco Use  . Smoking status: Never Smoker  . Smokeless tobacco: Never Used  Substance and Sexual Activity  . Alcohol use: No  . Drug use: No  . Sexual  activity: Yes    Partners: Male  Other Topics Concern  . Not on file  Social History Narrative   No regular exercise.          Social Determinants of Health   Financial Resource Strain:   . Difficulty of Paying Living Expenses:   Food Insecurity:   . Worried About Programme researcher, broadcasting/film/video in the Last Year:   . Barista in the Last Year:   Transportation Needs:   . Freight forwarder (Medical):   Marland Kitchen Lack of Transportation (Non-Medical):   Physical Activity:   . Days of Exercise per Week:   . Minutes of Exercise per Session:   Stress:   . Feeling of Stress :   Social Connections:   . Frequency of Communication with Friends and Family:   . Frequency of Social Gatherings with Friends and Family:   . Attends Religious Services:   . Active Member of Clubs or Organizations:   . Attends Banker Meetings:   Marland Kitchen Marital Status:   Intimate Partner Violence:   . Fear of Current or Ex-Partner:   . Emotionally Abused:   Marland Kitchen Physically Abused:   . Sexually Abused:     Outpatient Medications Prior to Visit  Medication Sig Dispense Refill  . amLODipine (NORVASC) 10 MG  tablet TAKE 1 TABLET BY MOUTH EVERY DAY 90 tablet 1  . spironolactone (ALDACTONE) 25 MG tablet TAKE 1 TABLET (25 MG TOTAL) BY MOUTH DAILY. 90 tablet 1  . metoprolol succinate (TOPROL-XL) 50 MG 24 hr tablet TAKE 1 TABLET BY MOUTH EVERY DAY 90 tablet 1   No facility-administered medications prior to visit.    No Known Allergies  ROS Review of Systems    Objective:    Physical Exam Constitutional:      Appearance: She is well-developed.  HENT:     Head: Normocephalic and atraumatic.  Cardiovascular:     Rate and Rhythm: Normal rate and regular rhythm.     Heart sounds: Normal heart sounds.  Pulmonary:     Effort: Pulmonary effort is normal.     Breath sounds: Normal breath sounds.  Skin:    General: Skin is warm and dry.  Neurological:     Mental Status: She is alert and oriented to person,  place, and time.  Psychiatric:        Behavior: Behavior normal.     BP (!) 137/85   Pulse 72   Ht 5' (1.524 m)   Wt (!) 229 lb (103.9 kg)   SpO2 99%   BMI 44.72 kg/m  Wt Readings from Last 3 Encounters:  12/22/19 (!) 229 lb (103.9 kg)  10/20/19 224 lb (101.6 kg)  06/27/19 222 lb (100.7 kg)     Health Maintenance Due  Topic Date Due  . Hepatitis C Screening  Never done    There are no preventive care reminders to display for this patient.  Lab Results  Component Value Date   TSH 1.345 04/01/2015   Lab Results  Component Value Date   WBC 7.6 04/19/2019   HGB 14.9 04/19/2019   HCT 44.2 04/19/2019   MCV 87.4 04/19/2019   PLT 246 04/19/2019   Lab Results  Component Value Date   NA 137 10/20/2019   K 3.9 10/20/2019   CO2 24 10/20/2019   GLUCOSE 85 10/20/2019   BUN 10 10/20/2019   CREATININE 0.73 10/20/2019   BILITOT 0.8 04/19/2019   ALKPHOS 56 04/06/2016   AST 19 04/19/2019   ALT 26 04/19/2019   PROT 7.0 04/19/2019   ALBUMIN 4.3 04/06/2016   CALCIUM 9.2 10/20/2019   Lab Results  Component Value Date   CHOL 193 04/19/2019   Lab Results  Component Value Date   HDL 51 04/19/2019   Lab Results  Component Value Date   LDLCALC 112 (H) 04/19/2019   Lab Results  Component Value Date   TRIG 178 (H) 04/19/2019   Lab Results  Component Value Date   CHOLHDL 3.8 04/19/2019   Lab Results  Component Value Date   HGBA1C 5.4 10/20/2019      Assessment & Plan:   Problem List Items Addressed This Visit      Cardiovascular and Mediastinum   Essential hypertension - Primary    Pressure still borderline today.  We will repeat BP at the end of the visit just to verify but she says she is actually been getting pretty similar numbers at home.  Discussed options.  We will go ahead and increase metoprolol to 100 mg a day in addition continue to work on healthy diet and weight loss.  She admits she is gained a little bit of weight back this summer and they have  been eating out a lot more.  She did feel better when she was weighing a little  less.  I also like to screen her for secondary causes such as sleep apnea.  Renal function was normal.      Relevant Medications   metoprolol succinate (TOPROL-XL) 100 MG 24 hr tablet   Other Relevant Orders   Home sleep test     Other   Snoring    HeadStop bang questionnaire score of 4 which is significant for a potential high risk of sleep apnea.  Recommend further evaluation we did discuss home study versus in lab study.  We will may have to see what her insurance will cover she is otherwise low risk and that she does not have any underlying pulmonary or cardiac conditions.      Relevant Orders   Home sleep test      Meds ordered this encounter  Medications  . metoprolol succinate (TOPROL-XL) 100 MG 24 hr tablet    Sig: Take 1 tablet (100 mg total) by mouth daily. Take with or immediately following a meal.    Dispense:  30 tablet    Refill:  2    Follow-up: Return for Keep follow up.  . in November.    Nani Gasser, MD

## 2020-03-08 ENCOUNTER — Other Ambulatory Visit: Payer: Self-pay | Admitting: Family Medicine

## 2020-03-12 ENCOUNTER — Other Ambulatory Visit: Payer: Self-pay | Admitting: Family Medicine

## 2020-03-14 ENCOUNTER — Other Ambulatory Visit: Payer: Self-pay | Admitting: Family Medicine

## 2020-03-14 DIAGNOSIS — I10 Essential (primary) hypertension: Secondary | ICD-10-CM

## 2020-03-14 DIAGNOSIS — Z1231 Encounter for screening mammogram for malignant neoplasm of breast: Secondary | ICD-10-CM

## 2020-03-29 ENCOUNTER — Other Ambulatory Visit: Payer: Self-pay | Admitting: Family Medicine

## 2020-04-24 ENCOUNTER — Encounter: Payer: BC Managed Care – PPO | Admitting: Family Medicine

## 2020-05-15 ENCOUNTER — Ambulatory Visit (INDEPENDENT_AMBULATORY_CARE_PROVIDER_SITE_OTHER): Payer: BC Managed Care – PPO

## 2020-05-15 ENCOUNTER — Other Ambulatory Visit: Payer: Self-pay

## 2020-05-15 ENCOUNTER — Encounter: Payer: BC Managed Care – PPO | Admitting: Family Medicine

## 2020-05-15 DIAGNOSIS — Z1231 Encounter for screening mammogram for malignant neoplasm of breast: Secondary | ICD-10-CM

## 2020-05-21 ENCOUNTER — Telehealth (INDEPENDENT_AMBULATORY_CARE_PROVIDER_SITE_OTHER): Payer: BC Managed Care – PPO | Admitting: Family Medicine

## 2020-05-21 ENCOUNTER — Encounter: Payer: Self-pay | Admitting: Family Medicine

## 2020-05-21 DIAGNOSIS — U071 COVID-19: Secondary | ICD-10-CM | POA: Diagnosis not present

## 2020-05-21 DIAGNOSIS — R059 Cough, unspecified: Secondary | ICD-10-CM

## 2020-05-21 NOTE — Progress Notes (Signed)
Virtual Visit via Video Note  I connected with [name] on 05/21/20 at  1:00 PM EST by a video enabled telemedicine application and verified that I am speaking with the correct person using two identifiers.   I discussed the limitations of evaluation and management by telemedicine and the availability of in person appointments. The patient expressed understanding and agreed to proceed.  Patient location: at home  Provider location: in office  Subjective:    CC: Cough   HPI: sxs x 4 days sore throat,congestion,sinus congestion, cough.  No f/s/c/n/v/d/body aches.  She has only taken Sudafed Q12H x 3 (7AM), nyquil.  She denies any sick contacts. Did PCR this am. Home test was positive yesterday. she is triple vaccinated. Went out to eat Wednesday night.    Past medical history, Surgical history, Family history not pertinant except as noted below, Social history, Allergies, and medications have been entered into the medical record, reviewed, and corrections made.   Review of Systems: No fevers, chills, night sweats, weight loss, chest pain, or shortness of breath.   Objective:    General: Speaking clearly in complete sentences without any shortness of breath.  Alert and oriented x3.  Normal judgment. No apparent acute distress.    Impression and Recommendations:    No problem-specific Assessment & Plan notes found for this encounter.  COVID-19 - discussed signs and symptoms. Recommend symptomatic care.  She is triple vaccinated.  Discussed updated quarantine guidelines for her and her family. Call if any worsening sxs.      Time spent in encounter 20 minutes  I discussed the assessment and treatment plan with the patient. The patient was provided an opportunity to ask questions and all were answered. The patient agreed with the plan and demonstrated an understanding of the instructions.   The patient was advised to call back or seek an in-person evaluation if the symptoms  worsen or if the condition fails to improve as anticipated.   Nani Gasser, MD

## 2020-05-21 NOTE — Progress Notes (Signed)
sxs x 4 days sore throat,congestion,sinus congestion, cough.  No f/s/c/n/v/d/body aches  She has only taken Sudafed Q12H x 3 (7AM), nyquil.  She denies any sick contacts.   Did PCR this am. Home test was positive yesterday.

## 2020-05-22 ENCOUNTER — Encounter: Payer: BC Managed Care – PPO | Admitting: Family Medicine

## 2020-06-05 ENCOUNTER — Other Ambulatory Visit: Payer: Self-pay

## 2020-06-05 ENCOUNTER — Ambulatory Visit (INDEPENDENT_AMBULATORY_CARE_PROVIDER_SITE_OTHER): Payer: BC Managed Care – PPO | Admitting: Family Medicine

## 2020-06-05 ENCOUNTER — Encounter: Payer: Self-pay | Admitting: Family Medicine

## 2020-06-05 VITALS — BP 138/68 | HR 72 | Ht 59.5 in | Wt 235.0 lb

## 2020-06-05 DIAGNOSIS — Z0000 Encounter for general adult medical examination without abnormal findings: Secondary | ICD-10-CM | POA: Diagnosis not present

## 2020-06-05 DIAGNOSIS — Z124 Encounter for screening for malignant neoplasm of cervix: Secondary | ICD-10-CM | POA: Diagnosis not present

## 2020-06-05 NOTE — Patient Instructions (Signed)
Health Maintenance, Female Adopting a healthy lifestyle and getting preventive care are important in promoting health and wellness. Ask your health care provider about:  The right schedule for you to have regular tests and exams.  Things you can do on your own to prevent diseases and keep yourself healthy. What should I know about diet, weight, and exercise? Eat a healthy diet  Eat a diet that includes plenty of vegetables, fruits, low-fat dairy products, and lean protein.  Do not eat a lot of foods that are high in solid fats, added sugars, or sodium.   Maintain a healthy weight Body mass index (BMI) is used to identify weight problems. It estimates body fat based on height and weight. Your health care provider can help determine your BMI and help you achieve or maintain a healthy weight. Get regular exercise Get regular exercise. This is one of the most important things you can do for your health. Most adults should:  Exercise for at least 150 minutes each week. The exercise should increase your heart rate and make you sweat (moderate-intensity exercise).  Do strengthening exercises at least twice a week. This is in addition to the moderate-intensity exercise.  Spend less time sitting. Even light physical activity can be beneficial. Watch cholesterol and blood lipids Have your blood tested for lipids and cholesterol at 44 years of age, then have this test every 5 years. Have your cholesterol levels checked more often if:  Your lipid or cholesterol levels are high.  You are older than 44 years of age.  You are at high risk for heart disease. What should I know about cancer screening? Depending on your health history and family history, you may need to have cancer screening at various ages. This may include screening for:  Breast cancer.  Cervical cancer.  Colorectal cancer.  Skin cancer.  Lung cancer. What should I know about heart disease, diabetes, and high blood  pressure? Blood pressure and heart disease  High blood pressure causes heart disease and increases the risk of stroke. This is more likely to develop in people who have high blood pressure readings, are of African descent, or are overweight.  Have your blood pressure checked: ? Every 3-5 years if you are 18-39 years of age. ? Every year if you are 40 years old or older. Diabetes Have regular diabetes screenings. This checks your fasting blood sugar level. Have the screening done:  Once every three years after age 40 if you are at a normal weight and have a low risk for diabetes.  More often and at a younger age if you are overweight or have a high risk for diabetes. What should I know about preventing infection? Hepatitis B If you have a higher risk for hepatitis B, you should be screened for this virus. Talk with your health care provider to find out if you are at risk for hepatitis B infection. Hepatitis C Testing is recommended for:  Everyone born from 1945 through 1965.  Anyone with known risk factors for hepatitis C. Sexually transmitted infections (STIs)  Get screened for STIs, including gonorrhea and chlamydia, if: ? You are sexually active and are younger than 44 years of age. ? You are older than 44 years of age and your health care provider tells you that you are at risk for this type of infection. ? Your sexual activity has changed since you were last screened, and you are at increased risk for chlamydia or gonorrhea. Ask your health care provider   if you are at risk.  Ask your health care provider about whether you are at high risk for HIV. Your health care provider may recommend a prescription medicine to help prevent HIV infection. If you choose to take medicine to prevent HIV, you should first get tested for HIV. You should then be tested every 3 months for as long as you are taking the medicine. Pregnancy  If you are about to stop having your period (premenopausal) and  you may become pregnant, seek counseling before you get pregnant.  Take 400 to 800 micrograms (mcg) of folic acid every day if you become pregnant.  Ask for birth control (contraception) if you want to prevent pregnancy. Osteoporosis and menopause Osteoporosis is a disease in which the bones lose minerals and strength with aging. This can result in bone fractures. If you are 65 years old or older, or if you are at risk for osteoporosis and fractures, ask your health care provider if you should:  Be screened for bone loss.  Take a calcium or vitamin D supplement to lower your risk of fractures.  Be given hormone replacement therapy (HRT) to treat symptoms of menopause. Follow these instructions at home: Lifestyle  Do not use any products that contain nicotine or tobacco, such as cigarettes, e-cigarettes, and chewing tobacco. If you need help quitting, ask your health care provider.  Do not use street drugs.  Do not share needles.  Ask your health care provider for help if you need support or information about quitting drugs. Alcohol use  Do not drink alcohol if: ? Your health care provider tells you not to drink. ? You are pregnant, may be pregnant, or are planning to become pregnant.  If you drink alcohol: ? Limit how much you use to 0-1 drink a day. ? Limit intake if you are breastfeeding.  Be aware of how much alcohol is in your drink. In the [place] one drink equals one 12 oz bottle of beer (355 mL), one 5 oz glass of wine (148 mL), or one 1 oz glass of hard liquor (44 mL). General instructions  Schedule regular health, dental, and eye exams.  Stay current with your vaccines.  Tell your health care provider if: ? You often feel depressed. ? You have ever been abused or do not feel safe at home. Summary  Adopting a healthy lifestyle and getting preventive care are important in promoting health and wellness.  Follow your health care provider's instructions about healthy  diet, exercising, and getting tested or screened for diseases.  Follow your health care provider's instructions on monitoring your cholesterol and blood pressure. This information is not intended to replace advice given to you by your health care provider. Make sure you discuss any questions you have with your health care provider. Document Revised: 05/04/2018 Document Reviewed: 05/04/2018 Elsevier Patient Education  2021 Elsevier Inc.  

## 2020-06-05 NOTE — Progress Notes (Signed)
Subjective:     [name] is a 45 y.o. female and is here for a comprehensive physical exam. The patient reports no problems.  She says she has recently started to get back on track with doing intermittent fasting which has worked well for her in the past after the holidays.  She really wants to get more healthy and get her blood glucose levels down below.  She does have PCOS as well as impaired fasting glucose.  Social History   Socioeconomic History  . Marital status: Married    Spouse name: Not on file  . Number of children: 2  . Years of education: Not on file  . Highest education level: Not on file  Occupational History  . Occupation: LIB    Employer: [employer]  Tobacco Use  . Smoking status: Never Smoker  . Smokeless tobacco: Never Used  Substance and Sexual Activity  . Alcohol use: No  . Drug use: No  . Sexual activity: Yes    Partners: Male  Other Topics Concern  . Not on file  Social History Narrative   No regular exercise.          Social Determinants of Health   Financial Resource Strain: Not on file  Food Insecurity: Not on file  Transportation Needs: Not on file  Physical Activity: Not on file  Stress: Not on file  Social Connections: Not on file  Intimate Partner Violence: Not on file   Health Maintenance  Topic Date Due  . Hepatitis C Screening  Never done  . PAP SMEAR-Modifier  03/31/2020  . TETANUS/TDAP  04/13/2028  . INFLUENZA VACCINE  Completed  . COVID-19 Vaccine  Completed  . HIV Screening  Completed    The following portions of the patient's history were reviewed and updated as appropriate: allergies, current medications, past family history, past medical history, past social history, past surgical history and problem list.  Review of Systems A comprehensive review of systems was negative.   Objective:    BP 138/68   Pulse 72   Ht 4' 11.5" (1.511 m)   Wt 235 lb (106.6 kg)   LMP 06/05/2020 (Exact Date)   SpO2 98%   BMI  46.67 kg/m  General appearance: alert, cooperative and appears stated age Head: Normocephalic, without obvious abnormality, atraumatic Eyes: conj clear, EOMI, PEERLA Ears: normal TM's and external ear canals both ears Nose: Nares normal. Septum midline. Mucosa normal. No drainage or sinus tenderness. Throat: lips, mucosa, and tongue normal; teeth and gums normal Neck: no adenopathy, no carotid bruit, no JVD, supple, symmetrical, trachea midline and thyroid not enlarged, symmetric, no tenderness/mass/nodules Back: symmetric, no curvature. ROM normal. No CVA tenderness. Lungs: clear to auscultation bilaterally Heart: regular rate and rhythm, S1, S2 normal, no murmur, click, rub or gallop Abdomen: soft, non-tender; bowel sounds normal; no masses,  no organomegaly Extremities: extremities normal, atraumatic, no cyanosis or edema Pulses: 2+ and symmetric Skin: Skin color, texture, turgor normal. No rashes or lesions Lymph nodes: Cervical adenopathy: nl and Supraclavicular adenopathy: nl Neurologic: Alert and oriented X 3, normal strength and tone. Normal symmetric reflexes. Normal coordination and gait    Assessment:    Healthy female exam.     Plan:     See After Visit Summary for Counseling Recommendations   Keep up a regular exercise program and make sure you are eating a healthy diet Try to eat 4 servings of dairy a day, or if you are lactose intolerant  take a calcium with vitamin D daily.  Your vaccines are up to date.  She had a normal mammogram 2 weeks ago. Is due for Pap smear but started her period 2 days ago and so we will reschedule that. Due for labs.  She will go today.

## 2020-06-06 LAB — COMPLETE METABOLIC PANEL WITH GFR
AG Ratio: 1.7 (calc) (ref 1.0–2.5)
ALT: 21 U/L (ref 6–29)
AST: 18 U/L (ref 10–30)
Albumin: 4.3 g/dL (ref 3.6–5.1)
Alkaline phosphatase (APISO): 50 U/L (ref 31–125)
BUN: 12 mg/dL (ref 7–25)
CO2: 25 mmol/L (ref 20–32)
Calcium: 9.3 mg/dL (ref 8.6–10.2)
Chloride: 100 mmol/L (ref 98–110)
Creat: 0.71 mg/dL (ref 0.50–1.10)
GFR, Est African American: 121 mL/min/{1.73_m2} (ref >=60)
GFR, Est Non African American: 104 mL/min/{1.73_m2} (ref >=60)
Globulin: 2.6 g/dL (calc) (ref 1.9–3.7)
Glucose, Bld: 86 mg/dL (ref 65–99)
Potassium: 4.2 mmol/L (ref 3.5–5.3)
Sodium: 136 mmol/L (ref 135–146)
Total Bilirubin: 0.7 mg/dL (ref 0.2–1.2)
Total Protein: 6.9 g/dL (ref 6.1–8.1)

## 2020-06-06 LAB — CBC
HCT: 44.5 % (ref 35.0–45.0)
Hemoglobin: 15.2 g/dL (ref 11.7–15.5)
MCH: 30.5 pg (ref 27.0–33.0)
MCHC: 34.2 g/dL (ref 32.0–36.0)
MCV: 89.2 fL (ref 80.0–100.0)
MPV: 11.8 fL (ref 7.5–12.5)
Platelets: 264 10*3/uL (ref 140–400)
RBC: 4.99 10*6/uL (ref 3.80–5.10)
RDW: 13 % (ref 11.0–15.0)
WBC: 6.1 10*3/uL (ref 3.8–10.8)

## 2020-06-06 LAB — LIPID PANEL W/REFLEX DIRECT LDL
Cholesterol: 220 mg/dL — ABNORMAL HIGH (ref <200)
HDL: 50 mg/dL (ref >=50)
LDL Cholesterol (Calc): 139 mg/dL (calc) — ABNORMAL HIGH
Non-HDL Cholesterol (Calc): 170 mg/dL (calc) — ABNORMAL HIGH (ref <130)
Total CHOL/HDL Ratio: 4.4 (calc) (ref <5.0)
Triglycerides: 171 mg/dL — ABNORMAL HIGH (ref <150)

## 2020-06-06 LAB — HEMOGLOBIN A1C
Hgb A1c MFr Bld: 5.8 % of total Hgb — ABNORMAL HIGH (ref <5.7)
Mean Plasma Glucose: 120 mg/dL
eAG (mmol/L): 6.6 mmol/L

## 2020-06-08 ENCOUNTER — Other Ambulatory Visit: Payer: Self-pay | Admitting: Family Medicine

## 2020-06-08 DIAGNOSIS — I10 Essential (primary) hypertension: Secondary | ICD-10-CM

## 2020-06-18 ENCOUNTER — Other Ambulatory Visit (HOSPITAL_COMMUNITY)
Admission: RE | Admit: 2020-06-18 | Discharge: 2020-06-18 | Disposition: A | Discharge status: Home / Self Care | Payer: BC Managed Care – PPO | Source: Ambulatory Visit | Attending: Family Medicine | Admitting: Family Medicine

## 2020-06-18 ENCOUNTER — Ambulatory Visit (INDEPENDENT_AMBULATORY_CARE_PROVIDER_SITE_OTHER): Payer: BC Managed Care – PPO | Admitting: Family Medicine

## 2020-06-18 ENCOUNTER — Other Ambulatory Visit: Payer: Self-pay

## 2020-06-18 ENCOUNTER — Encounter: Payer: Self-pay | Admitting: Family Medicine

## 2020-06-18 VITALS — Ht 60.0 in | Wt 235.0 lb

## 2020-06-18 DIAGNOSIS — Z124 Encounter for screening for malignant neoplasm of cervix: Secondary | ICD-10-CM | POA: Diagnosis not present

## 2020-06-18 NOTE — Progress Notes (Signed)
Established Patient Office Visit  Subjective:  Patient ID: [name], female    DOB: 1975/06/30  Age: 45 y.o. MRN: [id number]  CC:  Chief Complaint  Patient presents with  . Gynecologic Exam    HPI [name] presents for Pap smear only.  Recently here for CPE but was bleeding at this time.  Nonew problems or pelvic concerns.   Past Medical History:  Diagnosis Date  . Hypertension   . IBS (irritable bowel syndrome)   . PCOS (polycystic ovarian syndrome)     Past Surgical History:  Procedure Laterality Date  . CESAREAN SECTION  2008  . CESAREAN SECTION  02/02/2011   Procedure: CESAREAN SECTION;  Surgeon: Jeani Hawking, MD;  Location: [facility];  Service: Gynecology;  Laterality: N/A;  . ESSURE TUBAL LIGATION  03/26/11    Family History  Problem Relation Age of Onset  . Hypertension Other   . Heart attack Paternal Grandmother   . Diabetes Mother   . Hypertension Mother   . Hyperlipidemia Mother   . Pancreatic cancer Father   . Cancer Paternal Grandfather        Kidney and liver    Social History   Socioeconomic History  . Marital status: Married    Spouse name: Not on file  . Number of children: 2  . Years of education: Not on file  . Highest education level: Not on file  Occupational History  . Occupation: LIB    Employer: [employer]  Tobacco Use  . Smoking status: Never Smoker  . Smokeless tobacco: Never Used  Substance and Sexual Activity  . Alcohol use: No  . Drug use: No  . Sexual activity: Yes    Partners: Male  Other Topics Concern  . Not on file  Social History Narrative   No regular exercise.          Social Determinants of Health   Financial Resource Strain: Not on file  Food Insecurity: Not on file  Transportation Needs: Not on file  Physical Activity: Not on file  Stress: Not on file  Social Connections: Not on file  Intimate Partner Violence: Not on file    Outpatient Medications Prior to Visit  Medication Sig  Dispense Refill  . amLODipine (NORVASC) 10 MG tablet TAKE 1 TABLET BY MOUTH EVERY DAY 90 tablet 1  . metoprolol succinate (TOPROL-XL) 100 MG 24 hr tablet Take 1 tablet (100 mg total) by mouth daily. 90 tablet 0  . SODIUM FLUORIDE 5000 PPM 1.1 % PSTE Take by mouth.    . spironolactone (ALDACTONE) 25 MG tablet TAKE 1 TABLET (25 MG TOTAL) BY MOUTH DAILY. 90 tablet 1   No facility-administered medications prior to visit.    No Known Allergies  ROS Review of Systems    Objective:    Physical Exam Vitals reviewed. Exam conducted with a chaperone present.  Constitutional:      Appearance: She is well-developed and well-nourished.  HENT:     Head: Normocephalic and atraumatic.  Eyes:     Extraocular Movements: EOM normal.     Conjunctiva/sclera: Conjunctivae normal.  Cardiovascular:     Rate and Rhythm: Normal rate.  Pulmonary:     Effort: Pulmonary effort is normal.  Genitourinary:    General: Normal vulva.     Vagina: Normal.     Uterus: Normal.      Adnexa: Right adnexa normal and left adnexa normal.     Rectum: Normal.  Comments: difficult exam to get a good view of the cervix.   Skin:    General: Skin is dry.     Coloration: Skin is not pale.  Neurological:     Mental Status: She is alert and oriented to person, place, and time.  Psychiatric:        Mood and Affect: Mood and affect normal.        Behavior: Behavior normal.     Ht 5' (1.524 m)   Wt 235 lb (106.6 kg)   LMP 06/05/2020 (Exact Date)   BMI 45.90 kg/m  Wt Readings from Last 3 Encounters:  06/18/20 235 lb (106.6 kg)  06/05/20 235 lb (106.6 kg)  12/22/19 (!) 229 lb (103.9 kg)     Health Maintenance Due  Topic Date Due  . Hepatitis C Screening  Never done  . PAP SMEAR-Modifier  03/31/2020    There are no preventive care reminders to display for this patient.  Lab Results  Component Value Date   TSH 1.345 04/01/2015   Lab Results  Component Value Date   WBC 6.1 06/05/2020   HGB 15.2  06/05/2020   HCT 44.5 06/05/2020   MCV 89.2 06/05/2020   PLT 264 06/05/2020   Lab Results  Component Value Date   NA 136 06/05/2020   K 4.2 06/05/2020   CO2 25 06/05/2020   GLUCOSE 86 06/05/2020   BUN 12 06/05/2020   CREATININE 0.71 06/05/2020   BILITOT 0.7 06/05/2020   ALKPHOS 56 04/06/2016   AST 18 06/05/2020   ALT 21 06/05/2020   PROT 6.9 06/05/2020   ALBUMIN 4.3 04/06/2016   CALCIUM 9.3 06/05/2020   Lab Results  Component Value Date   CHOL 220 (H) 06/05/2020   Lab Results  Component Value Date   HDL 50 06/05/2020   Lab Results  Component Value Date   LDLCALC 139 (H) 06/05/2020   Lab Results  Component Value Date   TRIG 171 (H) 06/05/2020   Lab Results  Component Value Date   CHOLHDL 4.4 06/05/2020   Lab Results  Component Value Date   HGBA1C 5.8 (H) 06/05/2020      Assessment & Plan:   Problem List Items Addressed This Visit   None   Visit Diagnoses    Screening for cervical cancer    -  Primary   Relevant Orders   Cytology - PAP     Pap performed. Will call with results.    No orders of the defined types were placed in this encounter.   Follow-up: No follow-ups on file.    Nani Gasser, MD

## 2020-06-20 LAB — CYTOLOGY - PAP
Comment: NEGATIVE
Diagnosis: NEGATIVE
High risk HPV: NEGATIVE

## 2020-06-20 NOTE — Progress Notes (Signed)
Call patient: Your Pap smear is normal. Repeat in 5 years.

## 2020-08-28 ENCOUNTER — Other Ambulatory Visit: Payer: Self-pay | Admitting: Family Medicine

## 2020-08-28 DIAGNOSIS — I10 Essential (primary) hypertension: Secondary | ICD-10-CM

## 2020-09-02 ENCOUNTER — Other Ambulatory Visit: Payer: Self-pay | Admitting: Family Medicine

## 2020-09-02 DIAGNOSIS — I10 Essential (primary) hypertension: Secondary | ICD-10-CM

## 2020-09-22 ENCOUNTER — Other Ambulatory Visit: Payer: Self-pay | Admitting: Family Medicine

## 2020-12-03 ENCOUNTER — Ambulatory Visit: Payer: BC Managed Care – PPO | Admitting: Family Medicine

## 2020-12-03 ENCOUNTER — Encounter: Payer: Self-pay | Admitting: Family Medicine

## 2020-12-03 ENCOUNTER — Other Ambulatory Visit: Payer: Self-pay

## 2020-12-03 VITALS — BP 133/88 | HR 75 | Ht 60.0 in | Wt 241.0 lb

## 2020-12-03 DIAGNOSIS — R7301 Impaired fasting glucose: Secondary | ICD-10-CM | POA: Diagnosis not present

## 2020-12-03 DIAGNOSIS — I10 Essential (primary) hypertension: Secondary | ICD-10-CM | POA: Diagnosis not present

## 2020-12-03 LAB — BASIC METABOLIC PANEL WITH GFR
BUN: 14 mg/dL (ref 7–25)
CO2: 28 mmol/L (ref 20–32)
Calcium: 9.8 mg/dL (ref 8.6–10.2)
Chloride: 98 mmol/L (ref 98–110)
Creat: 0.74 mg/dL (ref 0.50–0.99)
Glucose, Bld: 83 mg/dL (ref 65–99)
Potassium: 4.6 mmol/L (ref 3.5–5.3)
Sodium: 136 mmol/L (ref 135–146)
eGFR: 102 mL/min/{1.73_m2} (ref >=60)

## 2020-12-03 LAB — POCT GLYCOSYLATED HEMOGLOBIN (HGB A1C): Hemoglobin A1C: 5.4 % (ref 4.0–5.6)

## 2020-12-03 MED ORDER — METOPROLOL SUCCINATE ER 100 MG PO TB24: 100.0000 mg | ORAL_TABLET | Freq: Every day | ORAL | 1 refills | Status: DC

## 2020-12-03 MED ORDER — METOPROLOL SUCCINATE ER 100 MG PO TB24: 100.0000 mg | ORAL_TABLET | Freq: Every day | ORAL | 0 refills | Status: DC

## 2020-12-03 NOTE — Assessment & Plan Note (Signed)
Well controlled. Continue current regimen. Follow up in  6 mo  

## 2020-12-03 NOTE — Progress Notes (Signed)
Established Patient Office Visit  Subjective:  Patient ID: [name], female    DOB: April 21, 1976  Age: 45 y.o. MRN: [id number]  CC:  Chief Complaint  Patient presents with   Hypertension   ifg    HPI CHARMAN BLASCO presents for   Hypertension- Pt denies chest pain, SOB, dizziness, or heart palpitations.  Taking meds as directed w/o problems.  Denies medication side effects.  She is been doing intermittent fasting and and says that has really helped lower her glucose levels though she has not been able to really lose weight through the summer she has started going swimming 2 to 3 days/week and has also just spent time trying to relax in the pool and says it has really helped her mentally and to be able to really relax a little bit better in the evenings.  Impaired fasting glucose-no increased thirst or urination. No symptoms consistent with hypoglycemia.   Past Medical History:  Diagnosis Date   Hypertension    IBS (irritable bowel syndrome)    PCOS (polycystic ovarian syndrome)     Past Surgical History:  Procedure Laterality Date   CESAREAN SECTION  2008   CESAREAN SECTION  02/02/2011   Procedure: CESAREAN SECTION;  Surgeon: Jeani Hawking, MD;  Location: [facility];  Service: Gynecology;  Laterality: N/A;   ESSURE TUBAL LIGATION  03/26/11    Family History  Problem Relation Age of Onset   Hypertension Other    Heart attack Paternal Grandmother    Diabetes Mother    Hypertension Mother    Hyperlipidemia Mother    Pancreatic cancer Father    Cancer Paternal Grandfather        Kidney and liver    Social History   Socioeconomic History   Marital status: Married    Spouse name: Not on file   Number of children: 2   Years of education: Not on file   Highest education level: Not on file  Occupational History   Occupation: LIB    Employer: [employer]  Tobacco Use   Smoking status: Never   Smokeless tobacco: Never  Substance and Sexual Activity    Alcohol use: No   Drug use: No   Sexual activity: Yes    Partners: Male  Other Topics Concern   Not on file  Social History Narrative   No regular exercise.          Social Determinants of Health   Financial Resource Strain: Not on file  Food Insecurity: Not on file  Transportation Needs: Not on file  Physical Activity: Not on file  Stress: Not on file  Social Connections: Not on file  Intimate Partner Violence: Not on file    Outpatient Medications Prior to Visit  Medication Sig Dispense Refill   amLODipine (NORVASC) 10 MG tablet TAKE 1 TABLET BY MOUTH EVERY DAY 90 tablet 1   SODIUM FLUORIDE 5000 PPM 1.1 % PSTE Take by mouth.     spironolactone (ALDACTONE) 25 MG tablet TAKE 1 TABLET BY MOUTH EVERY DAY 90 tablet 1   metoprolol succinate (TOPROL-XL) 100 MG 24 hr tablet TAKE 1 TABLET BY MOUTH EVERY DAY 90 tablet 0   No facility-administered medications prior to visit.    No Known Allergies  ROS Review of Systems    Objective:    Physical Exam Constitutional:      Appearance: Normal appearance. She is well-developed.  HENT:     Head: Normocephalic and atraumatic.  Cardiovascular:     Rate and Rhythm: Normal rate and regular rhythm.     Heart sounds: Normal heart sounds.  Pulmonary:     Effort: Pulmonary effort is normal.     Breath sounds: Normal breath sounds.  Skin:    General: Skin is warm and dry.  Neurological:     Mental Status: She is alert and oriented to person, place, and time.  Psychiatric:        Behavior: Behavior normal.    BP 133/88   Pulse 75   Ht 5' (1.524 m)   Wt 241 lb (109.3 kg)   SpO2 98%   BMI 47.07 kg/m  Wt Readings from Last 3 Encounters:  12/03/20 241 lb (109.3 kg)  06/18/20 235 lb (106.6 kg)  06/05/20 235 lb (106.6 kg)     Health Maintenance Due  Topic Date Due   Hepatitis C Screening  Never done    There are no preventive care reminders to display for this patient.  Lab Results  Component Value Date   TSH 1.345  04/01/2015   Lab Results  Component Value Date   WBC 6.1 06/05/2020   HGB 15.2 06/05/2020   HCT 44.5 06/05/2020   MCV 89.2 06/05/2020   PLT 264 06/05/2020   Lab Results  Component Value Date   NA 136 06/05/2020   K 4.2 06/05/2020   CO2 25 06/05/2020   GLUCOSE 86 06/05/2020   BUN 12 06/05/2020   CREATININE 0.71 06/05/2020   BILITOT 0.7 06/05/2020   ALKPHOS 56 04/06/2016   AST 18 06/05/2020   ALT 21 06/05/2020   PROT 6.9 06/05/2020   ALBUMIN 4.3 04/06/2016   CALCIUM 9.3 06/05/2020   Lab Results  Component Value Date   CHOL 220 (H) 06/05/2020   Lab Results  Component Value Date   HDL 50 06/05/2020   Lab Results  Component Value Date   LDLCALC 139 (H) 06/05/2020   Lab Results  Component Value Date   TRIG 171 (H) 06/05/2020   Lab Results  Component Value Date   CHOLHDL 4.4 06/05/2020   Lab Results  Component Value Date   HGBA1C 5.4 12/03/2020      Assessment & Plan:   Problem List Items Addressed This Visit       Cardiovascular and Mediastinum   Essential hypertension - Primary    Well controlled. Continue current regimen. Follow up in  6 mo        Relevant Medications   metoprolol succinate (TOPROL-XL) 100 MG 24 hr tablet   Other Relevant Orders   BASIC METABOLIC PANEL WITH GFR     Endocrine   IFG (impaired fasting glucose)    Well controlled. Continue current regimen. Follow up in  6 mo        Relevant Orders   POCT glycosylated hemoglobin (Hb A1C) (Completed)   BASIC METABOLIC PANEL WITH GFR    Meds ordered this encounter  Medications   DISCONTD: metoprolol succinate (TOPROL-XL) 100 MG 24 hr tablet    Sig: Take 1 tablet (100 mg total) by mouth daily. Take with or immediately following a meal.    Dispense:  90 tablet    Refill:  0   metoprolol succinate (TOPROL-XL) 100 MG 24 hr tablet    Sig: Take 1 tablet (100 mg total) by mouth daily. Take with or immediately following a meal.    Dispense:  90 tablet    Refill:  1      Follow-up: Return  in about 6 months (around 06/05/2021) for Hypertension and glucose.    Nani Gasser, MD

## 2020-12-04 NOTE — Progress Notes (Signed)
All labs are normal. 

## 2021-02-21 ENCOUNTER — Other Ambulatory Visit: Payer: Self-pay | Admitting: Family Medicine

## 2021-02-21 DIAGNOSIS — I10 Essential (primary) hypertension: Secondary | ICD-10-CM

## 2021-03-17 ENCOUNTER — Other Ambulatory Visit: Payer: Self-pay | Admitting: Family Medicine

## 2021-04-29 ENCOUNTER — Other Ambulatory Visit: Payer: Self-pay

## 2021-04-29 ENCOUNTER — Encounter: Payer: Self-pay | Admitting: Family Medicine

## 2021-04-29 ENCOUNTER — Ambulatory Visit: Payer: BC Managed Care – PPO | Admitting: Family Medicine

## 2021-04-29 VITALS — BP 156/79 | HR 98 | Temp 98.3°F | Resp 18 | Ht 60.0 in | Wt 242.0 lb

## 2021-04-29 DIAGNOSIS — R052 Subacute cough: Secondary | ICD-10-CM

## 2021-04-29 DIAGNOSIS — R058 Other specified cough: Secondary | ICD-10-CM

## 2021-04-29 MED ORDER — PREDNISONE 10 MG (21) PO TBPK: ORAL_TABLET | ORAL | 0 refills | Status: DC

## 2021-04-29 NOTE — Progress Notes (Signed)
Acute Office Visit  Subjective:    Patient ID: [name], female    DOB: 1976/05/09, 45 y.o.   MRN: [id number]  Chief Complaint  Patient presents with   Cough    Since 04/17/21. Productive cough, shortness of breath. Negative covid test on 04/18/21.     HPI Patient is in today for Cough and SOB that started around November 24.  Negative COVID test on November 25.she has had nasal congestion and cough.  He says just getting to the point where she just cannot get rid of the cough.  She feels like it is moved into her chest and feels like she is getting a lot of chest discomfort and tightness.  She feels like there is a lot of mucus is not moving out but she is getting some intermittently.  No fevers or chills.  She feels like the nasal congestion has actually gotten a little bit better in the last few days.  He has tried make DayQuil, NyQuil, Sudafed, Mucinex.  She is getting to the point where the medicines are not helping at all.  Does feel little short of breath with the cough.  Past Medical History:  Diagnosis Date   Hypertension    IBS (irritable bowel syndrome)    PCOS (polycystic ovarian syndrome)     Past Surgical History:  Procedure Laterality Date   CESAREAN SECTION  2008   CESAREAN SECTION  02/02/2011   Procedure: CESAREAN SECTION;  Surgeon: Cyril Mourning, MD;  Location: [facility];  Service: Gynecology;  Laterality: N/A;   ESSURE TUBAL LIGATION  03/26/11    Family History  Problem Relation Age of Onset   Hypertension Other    Heart attack Paternal Grandmother    Diabetes Mother    Hypertension Mother    Hyperlipidemia Mother    Pancreatic cancer Father    Cancer Paternal Grandfather        Kidney and liver    Social History   Socioeconomic History   Marital status: Married    Spouse name: Not on file   Number of children: 2   Years of education: Not on file   Highest education level: Not on file  Occupational History   Occupation: LIB    Employer:  [employer]  Tobacco Use   Smoking status: Never   Smokeless tobacco: Never  Substance and Sexual Activity   Alcohol use: No   Drug use: No   Sexual activity: Yes    Partners: Male  Other Topics Concern   Not on file  Social History Narrative   No regular exercise.          Social Determinants of Health   Financial Resource Strain: Not on file  Food Insecurity: Not on file  Transportation Needs: Not on file  Physical Activity: Not on file  Stress: Not on file  Social Connections: Not on file  Intimate Partner Violence: Not on file    Outpatient Medications Prior to Visit  Medication Sig Dispense Refill   amLODipine (NORVASC) 10 MG tablet TAKE 1 TABLET BY MOUTH EVERY DAY 90 tablet 1   metoprolol succinate (TOPROL-XL) 100 MG 24 hr tablet Take 1 tablet (100 mg total) by mouth daily. Take with or immediately following a meal. 90 tablet 1   SODIUM FLUORIDE 5000 PPM 1.1 % PSTE Take by mouth.     spironolactone (ALDACTONE) 25 MG tablet TAKE 1 TABLET BY MOUTH EVERY DAY 90 tablet 1   No  facility-administered medications prior to visit.    No Known Allergies  Review of Systems     Objective:    Physical Exam Constitutional:      Appearance: She is well-developed.  HENT:     Head: Normocephalic and atraumatic.     Right Ear: External ear normal.     Left Ear: External ear normal.     Nose: Nose normal.  Eyes:     Conjunctiva/sclera: Conjunctivae normal.     Pupils: Pupils are equal, round, and reactive to light.  Neck:     Thyroid: No thyromegaly.  Cardiovascular:     Rate and Rhythm: Normal rate and regular rhythm.     Heart sounds: Normal heart sounds.  Pulmonary:     Effort: Pulmonary effort is normal.     Breath sounds: Normal breath sounds. No wheezing.  Musculoskeletal:     Cervical back: Neck supple.  Lymphadenopathy:     Cervical: No cervical adenopathy.  Skin:    General: Skin is warm and dry.  Neurological:     Mental Status: She is alert  and oriented to person, place, and time.    BP (!) 156/79   Pulse 98   Temp 98.3 F (36.8 C)   Resp 18   Ht 5' (1.524 m)   Wt 242 lb (109.8 kg)   LMP 04/18/2021   SpO2 99%   BMI 47.26 kg/m  Wt Readings from Last 3 Encounters:  04/29/21 242 lb (109.8 kg)  12/03/20 241 lb (109.3 kg)  06/18/20 235 lb (106.6 kg)    Health Maintenance Due  Topic Date Due   Hepatitis C Screening  Never done    There are no preventive care reminders to display for this patient.   Lab Results  Component Value Date   TSH 1.345 04/01/2015   Lab Results  Component Value Date   WBC 6.1 06/05/2020   HGB 15.2 06/05/2020   HCT 44.5 06/05/2020   MCV 89.2 06/05/2020   PLT 264 06/05/2020   Lab Results  Component Value Date   NA 136 12/03/2020   K 4.6 12/03/2020   CO2 28 12/03/2020   GLUCOSE 83 12/03/2020   BUN 14 12/03/2020   CREATININE 0.74 12/03/2020   BILITOT 0.7 06/05/2020   ALKPHOS 56 04/06/2016   AST 18 06/05/2020   ALT 21 06/05/2020   PROT 6.9 06/05/2020   ALBUMIN 4.3 04/06/2016   CALCIUM 9.8 12/03/2020   EGFR 102 12/03/2020   Lab Results  Component Value Date   CHOL 220 (H) 06/05/2020   Lab Results  Component Value Date   HDL 50 06/05/2020   Lab Results  Component Value Date   LDLCALC 139 (H) 06/05/2020   Lab Results  Component Value Date   TRIG 171 (H) 06/05/2020   Lab Results  Component Value Date   CHOLHDL 4.4 06/05/2020   Lab Results  Component Value Date   HGBA1C 5.4 12/03/2020       Assessment & Plan:   Problem List Items Addressed This Visit   None Visit Diagnoses     Subacute cough    -  Primary   Post-viral cough syndrome          Viral cough-exam is clear today no sign of pneumonia or acute bronchitis I think mostly mucus in those in the main bronchus.  Since the over-the-counter's are no longer working we did discuss maybe doing a prednisone taper to see if this helps.  If she is  not feeling a little better by the end of the week please  give [place] call back.  Stay well-hydrated.  Call if any problems or concerns.   Meds ordered this encounter  Medications   predniSONE (STERAPRED UNI-PAK 21 TAB) 10 MG (21) TBPK tablet    Sig: Take as directed.    Dispense:  21 tablet    Refill:  0     Beatrice Lecher, MD

## 2021-05-14 ENCOUNTER — Other Ambulatory Visit (HOSPITAL_BASED_OUTPATIENT_CLINIC_OR_DEPARTMENT_OTHER): Payer: Self-pay | Admitting: Family Medicine

## 2021-05-14 DIAGNOSIS — Z1231 Encounter for screening mammogram for malignant neoplasm of breast: Secondary | ICD-10-CM

## 2021-05-21 ENCOUNTER — Ambulatory Visit (INDEPENDENT_AMBULATORY_CARE_PROVIDER_SITE_OTHER): Payer: BC Managed Care – PPO

## 2021-05-21 ENCOUNTER — Other Ambulatory Visit: Payer: Self-pay

## 2021-05-21 DIAGNOSIS — Z1231 Encounter for screening mammogram for malignant neoplasm of breast: Secondary | ICD-10-CM | POA: Diagnosis not present

## 2021-05-21 NOTE — Progress Notes (Signed)
Please call patient. Normal mammogram.  Repeat in 1 year.  

## 2021-06-09 ENCOUNTER — Encounter: Payer: Self-pay | Admitting: Family Medicine

## 2021-06-09 ENCOUNTER — Other Ambulatory Visit: Payer: Self-pay

## 2021-06-09 ENCOUNTER — Ambulatory Visit (INDEPENDENT_AMBULATORY_CARE_PROVIDER_SITE_OTHER): Payer: BC Managed Care – PPO | Admitting: Family Medicine

## 2021-06-09 VITALS — BP 137/69 | HR 65 | Temp 98.6°F | Resp 18 | Ht 60.0 in | Wt 244.0 lb

## 2021-06-09 DIAGNOSIS — Z0000 Encounter for general adult medical examination without abnormal findings: Secondary | ICD-10-CM | POA: Diagnosis not present

## 2021-06-09 LAB — COMPLETE METABOLIC PANEL WITH GFR
AG Ratio: 1.7 (calc) (ref 1.0–2.5)
ALT: 21 U/L (ref 6–29)
AST: 18 U/L (ref 10–30)
Albumin: 4.5 g/dL (ref 3.6–5.1)
Alkaline phosphatase (APISO): 50 U/L (ref 31–125)
BUN: 11 mg/dL (ref 7–25)
CO2: 31 mmol/L (ref 20–32)
Calcium: 9.8 mg/dL (ref 8.6–10.2)
Chloride: 102 mmol/L (ref 98–110)
Creat: 0.74 mg/dL (ref 0.50–0.99)
Globulin: 2.6 g/dL (calc) (ref 1.9–3.7)
Glucose, Bld: 100 mg/dL — ABNORMAL HIGH (ref 65–99)
Potassium: 4.6 mmol/L (ref 3.5–5.3)
Sodium: 138 mmol/L (ref 135–146)
Total Bilirubin: 0.5 mg/dL (ref 0.2–1.2)
Total Protein: 7.1 g/dL (ref 6.1–8.1)
eGFR: 102 mL/min/{1.73_m2} (ref >=60)

## 2021-06-09 LAB — CBC
HCT: 44.5 % (ref 35.0–45.0)
Hemoglobin: 15.1 g/dL (ref 11.7–15.5)
MCH: 30.1 pg (ref 27.0–33.0)
MCHC: 33.9 g/dL (ref 32.0–36.0)
MCV: 88.6 fL (ref 80.0–100.0)
MPV: 11.5 fL (ref 7.5–12.5)
Platelets: 243 10*3/uL (ref 140–400)
RBC: 5.02 10*6/uL (ref 3.80–5.10)
RDW: 13.1 % (ref 11.0–15.0)
WBC: 6.6 10*3/uL (ref 3.8–10.8)

## 2021-06-09 LAB — LIPID PANEL W/REFLEX DIRECT LDL
Cholesterol: 209 mg/dL — ABNORMAL HIGH (ref <200)
HDL: 49 mg/dL — ABNORMAL LOW (ref >=50)
LDL Cholesterol (Calc): 133 mg/dL (calc) — ABNORMAL HIGH
Non-HDL Cholesterol (Calc): 160 mg/dL (calc) — ABNORMAL HIGH (ref <130)
Total CHOL/HDL Ratio: 4.3 (calc) (ref <5.0)
Triglycerides: 148 mg/dL (ref <150)

## 2021-06-09 LAB — HEPATITIS C ANTIBODY
Hepatitis C Ab: NONREACTIVE
SIGNAL TO CUT-OFF: 0.1 (ref <1.00)

## 2021-06-09 NOTE — Progress Notes (Addendum)
Subjective:     [name] is a 46 y.o. female and is here for a comprehensive physical exam. The patient reports no problems.  She is doing well overall.  She is continuing to work on intermittent fasting and feels like she is starting to really notice some changes in her body.  She is doing well she said she did gain a few pounds back over the holidays.  She is already gotten that back off.  The summer when she turns 46 she would like to see Dr. Colen Darling at [facility] for a screening colonoscopy she will reach out a couple months before hands we can try to get it scheduled over the summer.  Mammogram was performed last month.  Tdap and flu vaccines are up-to-date.  COVID-vaccine is also up-to-date.  Social History   Socioeconomic History   Marital status: Married    Spouse name: Not on file   Number of children: 2   Years of education: Not on file   Highest education level: Not on file  Occupational History   Occupation: LIB    Employer: [employer]  Tobacco Use   Smoking status: Never   Smokeless tobacco: Never  Substance and Sexual Activity   Alcohol use: No   Drug use: No   Sexual activity: Yes    Partners: Male  Other Topics Concern   Not on file  Social History Narrative   No regular exercise, walk 1-2 times a week 1 can of pepsi a day    Social Determinants of Corporate investment banker Strain: Not on file  Food Insecurity: Not on file  Transportation Needs: Not on file  Physical Activity: Not on file  Stress: Not on file  Social Connections: Not on file  Intimate Partner Violence: Not on file   Health Maintenance  Topic Date Due   Hepatitis C Screening  Never done   COVID-19 Vaccine (4 - Booster for Moderna series) 05/20/2021   PAP SMEAR-Modifier  06/18/2025   TETANUS/TDAP  04/13/2028   INFLUENZA VACCINE  Completed   HIV Screening  Completed   Pneumococcal Vaccine 53-35 Years old  Aged Out   HPV  VACCINES  Aged Out    The following portions of the patient's history were reviewed and updated as appropriate: allergies, current medications, past family history, past medical history, past social history, past surgical history, and problem list.  Review of Systems A comprehensive review of systems was negative.   Objective:    BP (!) 139/96    Pulse 65    Temp 98.6 F (37 C)    Resp 18    Ht 5' (1.524 m)    Wt 244 lb (110.7 kg)    LMP 05/17/2021 Comment: irregular   SpO2 100%    BMI 47.65 kg/m  General appearance: alert, cooperative, and appears stated age Head: Normocephalic, without obvious abnormality, atraumatic Eyes:  conj clear, EOMI, PEERLA Ears: normal TM's and external ear canals both ears Nose: Nares normal. Septum midline. Mucosa normal. No drainage or sinus tenderness. Throat: lips, mucosa, and tongue normal; teeth and gums normal Neck: no adenopathy, no carotid bruit, no JVD, supple, symmetrical, trachea midline, and thyroid not enlarged, symmetric, no tenderness/mass/nodules Back: symmetric, no curvature. ROM normal. No CVA tenderness. Lungs: clear to auscultation bilaterally Breasts: normal appearance, no masses or tenderness Heart: regular rate and rhythm, S1, S2 normal, no murmur, click, rub or gallop Abdomen: soft, non-tender; bowel  sounds normal; no masses,  no organomegaly Extremities: extremities normal, atraumatic, no cyanosis or edema Pulses: 2+ and symmetric Skin: Skin color, texture, turgor normal. No rashes or lesions Lymph nodes: Cervical, supraclavicular, and axillary nodes normal. Neurologic: Grossly normal    Assessment:    Healthy female exam.      Plan:     See After Visit Summary for Counseling Recommendations  Keep up a regular exercise program and make sure you are eating a healthy diet Try to eat 4 servings of dairy a day, or if you are lactose intolerant take a calcium with vitamin D daily.  Your vaccines are up to date.

## 2021-06-10 NOTE — Progress Notes (Signed)
HI [name],  Your LDL cholesterol is still little elevated.  Just a little bit better than last year but not as good as 4 years ago.  Just continue to work on healthy diet and regular exercise.  Your blood count and metabolic panel are normal.  Negative hepatitis C.

## 2021-08-20 ENCOUNTER — Other Ambulatory Visit: Payer: Self-pay | Admitting: Family Medicine

## 2021-08-20 DIAGNOSIS — I10 Essential (primary) hypertension: Secondary | ICD-10-CM

## 2021-08-25 ENCOUNTER — Other Ambulatory Visit: Payer: Self-pay

## 2021-08-25 ENCOUNTER — Encounter: Payer: Self-pay | Admitting: Family Medicine

## 2021-08-25 DIAGNOSIS — Z1211 Encounter for screening for malignant neoplasm of colon: Secondary | ICD-10-CM

## 2021-11-15 ENCOUNTER — Other Ambulatory Visit: Payer: Self-pay | Admitting: Family Medicine

## 2021-11-15 DIAGNOSIS — I10 Essential (primary) hypertension: Secondary | ICD-10-CM

## 2021-12-08 ENCOUNTER — Ambulatory Visit: Payer: BC Managed Care – PPO | Admitting: Family Medicine

## 2021-12-08 ENCOUNTER — Encounter: Payer: Self-pay | Admitting: Family Medicine

## 2021-12-08 VITALS — BP 122/60 | HR 74 | Ht 60.0 in | Wt 247.0 lb

## 2021-12-08 DIAGNOSIS — I10 Essential (primary) hypertension: Secondary | ICD-10-CM | POA: Diagnosis not present

## 2021-12-08 DIAGNOSIS — R935 Abnormal findings on diagnostic imaging of other abdominal regions, including retroperitoneum: Secondary | ICD-10-CM

## 2021-12-08 DIAGNOSIS — R7301 Impaired fasting glucose: Secondary | ICD-10-CM | POA: Diagnosis not present

## 2021-12-08 DIAGNOSIS — N920 Excessive and frequent menstruation with regular cycle: Secondary | ICD-10-CM

## 2021-12-08 NOTE — Progress Notes (Signed)
Established Patient Office Visit  Subjective   Patient ID: [name], female    DOB: [birth year]  Age: 47 y.o. MRN: [id number]  Chief Complaint  Patient presents with   Hypertension    HPI  Hypertension- Pt denies chest pain, SOB, dizziness, or heart palpitations.  Taking meds as directed w/o problems.  Denies medication side effects.    She has a new job now.  It has been much less stressful.  This has beeen a good thing, but less pay.   He also let me know that Dr. Christoper Allegra did order a CT of the abdomen in June which did show 2 kidney stones and one gallstone. IMPRESSION:  1. Nonspecific bowel gas pattern.  2. There appears to be a couple of small left renal stones.  3. Gallstone.  4. Small appendicolith, otherwise appendix unremarkable.  5. 3.7 cm cystic focus left adnexa. This could represent a physiologic cyst.    Also noted that her last menstrual period was off a little bit.  She has PCOS and they are somewhat irregular but she said she just had spotting instead of a full period this last month.  She wonders if it could be hormonal.    ROS    Objective:     BP 122/60   Pulse 74   Ht 5' (1.524 m)   Wt 247 lb (112 kg)   SpO2 98%   BMI 48.24 kg/m    Physical Exam Vitals and nursing note reviewed.  Constitutional:      Appearance: She is well-developed.  HENT:     Head: Normocephalic and atraumatic.  Cardiovascular:     Rate and Rhythm: Normal rate and regular rhythm.     Heart sounds: Normal heart sounds.  Pulmonary:     Effort: Pulmonary effort is normal.     Breath sounds: Normal breath sounds.  Skin:    General: Skin is warm and dry.  Neurological:     Mental Status: She is alert and oriented to person, place, and time.  Psychiatric:        Behavior: Behavior normal.      No results found for any visits on 12/08/21.    The 10-year ASCVD risk score (Arnett DK, et al., 2019) is: 1.3%    Assessment & Plan:   Problem List Items Addressed  This Visit       Cardiovascular and Mediastinum   Essential hypertension - Primary    Well controlled. Continue current regimen. Follow up in  6 mo. Due for BMP      Relevant Orders   BASIC METABOLIC PANEL WITH GFR     Endocrine   IFG (impaired fasting glucose)    He is due for hemoglobin A1c.  We forgot to order with the BMP today.  We will plan to check with fingerstick when she returns.        Other   Abnormal CT of the abdomen    Noted to have left-sided kidney stones and gallstones.      Other Visit Diagnoses     Menstrual spotting          Menstrual  spotting-encouraged her to keep an eye on it for the next 6 months.  It certainly could be related to perimenopause note she is in her mid 29s.  But it may just be this 1 time.  We can always check hormone levels and thyroid if needed.  Return in about 6 months (around 06/10/2022) for  Hypertension.    Beatrice Lecher, MD

## 2021-12-08 NOTE — Assessment & Plan Note (Signed)
Well controlled. Continue current regimen. Follow up in  6 mo . Due for BMP 

## 2021-12-08 NOTE — Assessment & Plan Note (Signed)
Noted to have left-sided kidney stones and gallstones.

## 2021-12-08 NOTE — Assessment & Plan Note (Signed)
He is due for hemoglobin A1c.  We forgot to order with the BMP today.  We will plan to check with fingerstick when she returns.

## 2021-12-09 ENCOUNTER — Encounter: Payer: Self-pay | Admitting: Family Medicine

## 2021-12-09 LAB — HM COLONOSCOPY

## 2021-12-09 LAB — BASIC METABOLIC PANEL WITH GFR
BUN: 10 mg/dL (ref 7–25)
CO2: 25 mmol/L (ref 20–32)
Calcium: 9.8 mg/dL (ref 8.6–10.2)
Chloride: 100 mmol/L (ref 98–110)
Creat: 0.71 mg/dL (ref 0.50–0.99)
Glucose, Bld: 99 mg/dL (ref 65–99)
Potassium: 4.2 mmol/L (ref 3.5–5.3)
Sodium: 138 mmol/L (ref 135–146)
eGFR: 107 mL/min/{1.73_m2} (ref >=60)

## 2021-12-09 NOTE — Progress Notes (Signed)
Your lab work is within acceptable range and there are no concerning findings.   ?

## 2022-02-11 ENCOUNTER — Other Ambulatory Visit: Payer: Self-pay | Admitting: Family Medicine

## 2022-02-11 DIAGNOSIS — I10 Essential (primary) hypertension: Secondary | ICD-10-CM

## 2022-03-06 IMAGING — MG DIGITAL SCREENING BILAT W/ TOMO W/ CAD
8 series · 8 of 24 positions shown · non-contrast
Comparison: Previous exam(s).

CLINICAL DATA: Screening.

EXAM:
DIGITAL SCREENING BILATERAL MAMMOGRAM WITH TOMO AND CAD

[L CC synth-2D]
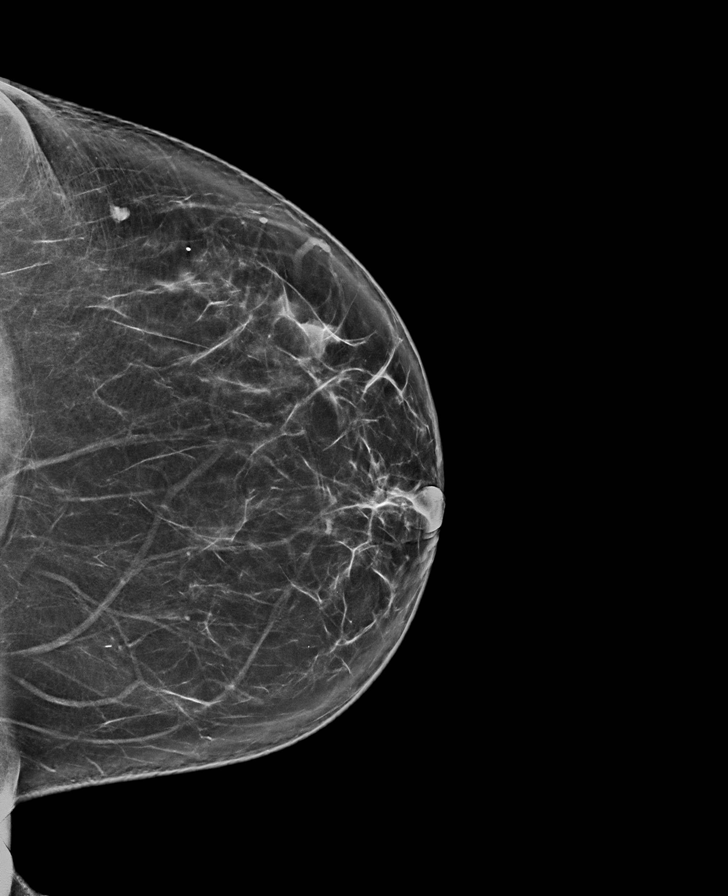

[R CC synth-2D]
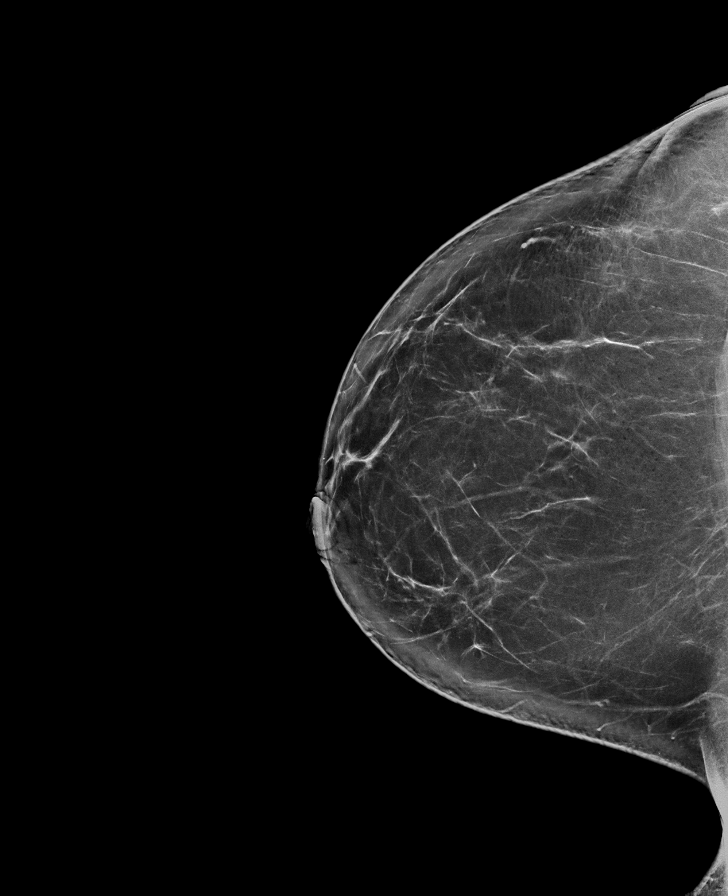

[L MLO synth-2D]
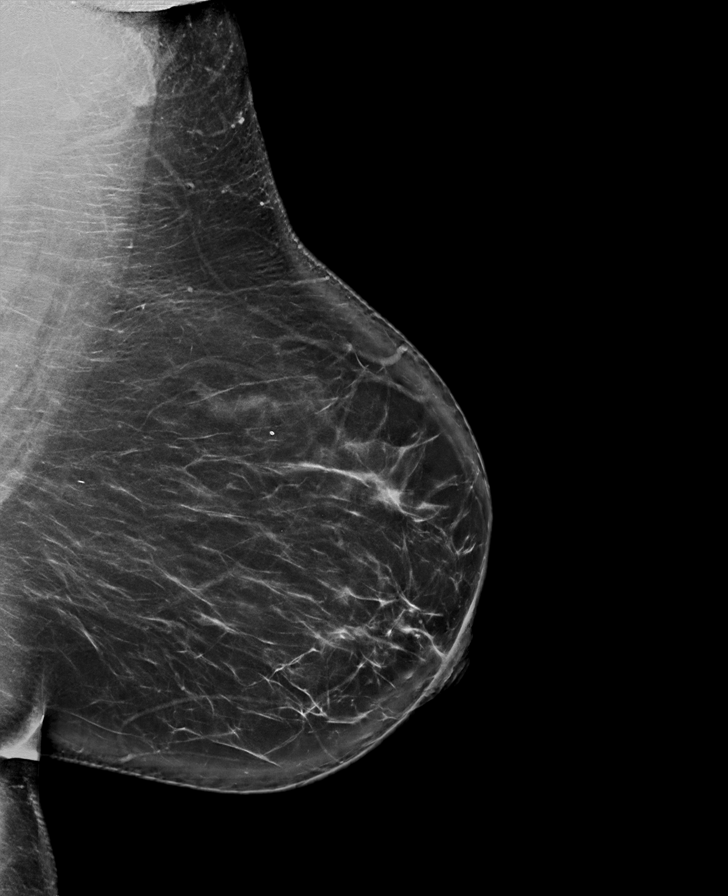

[R MLO synth-2D]
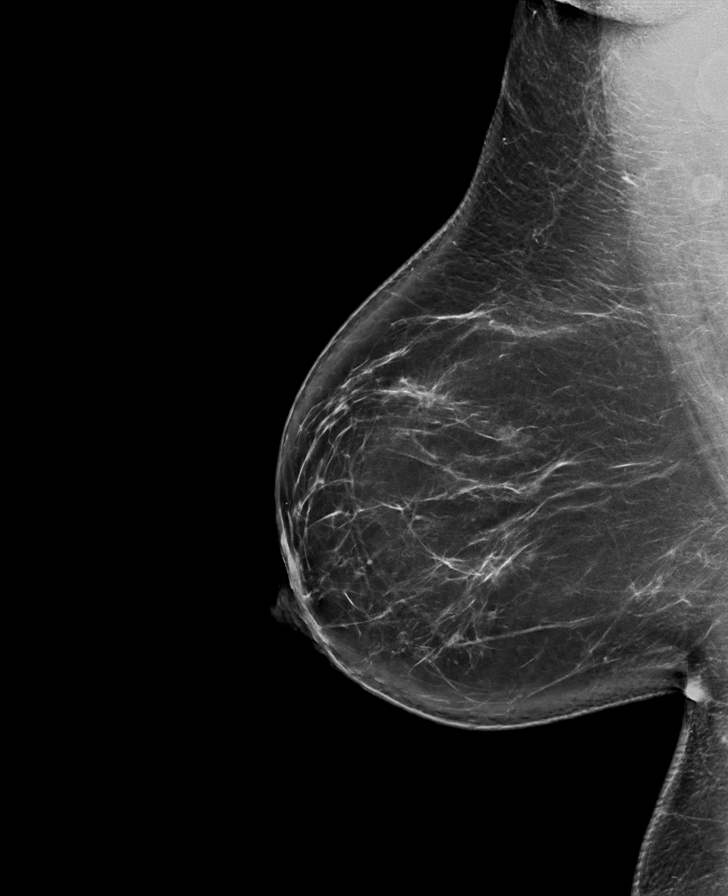

[L MLO tomo · tomo slice 49/97.0]
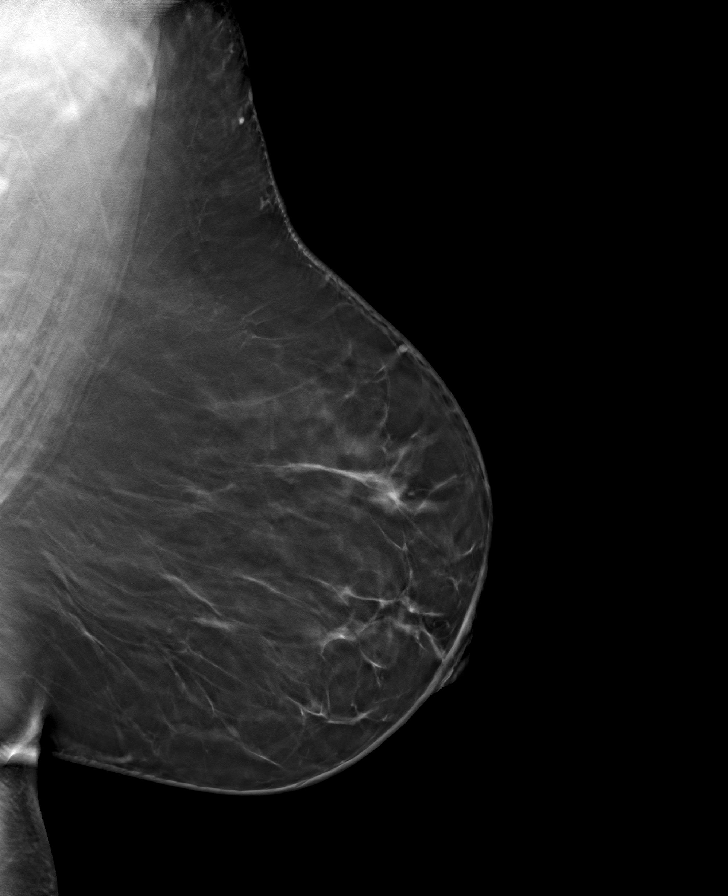

[R MLO tomo · tomo slice 49/96.0]
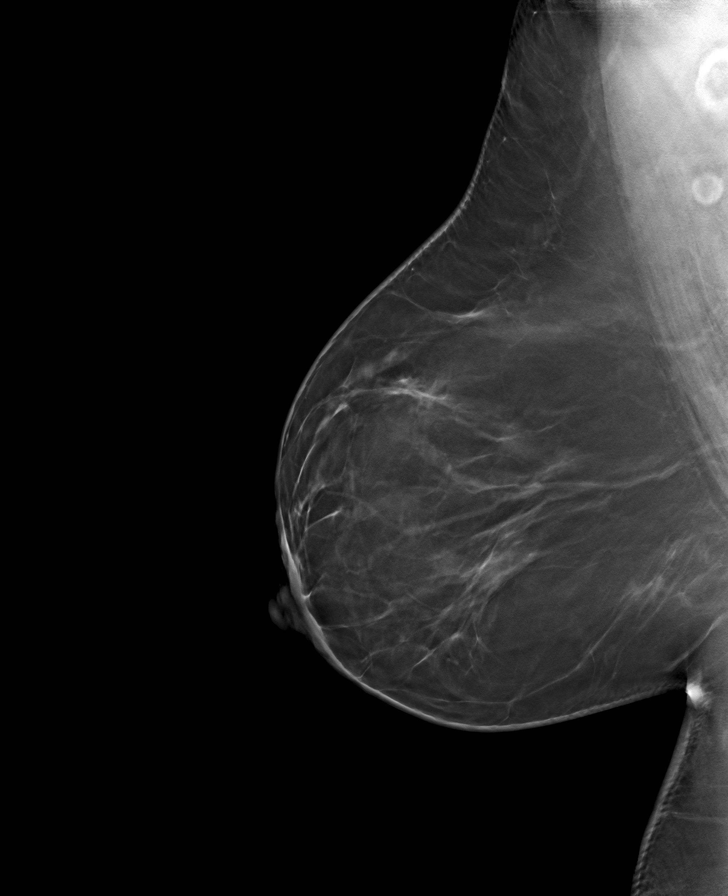

[L CC tomo · tomo slice 42/83.0]
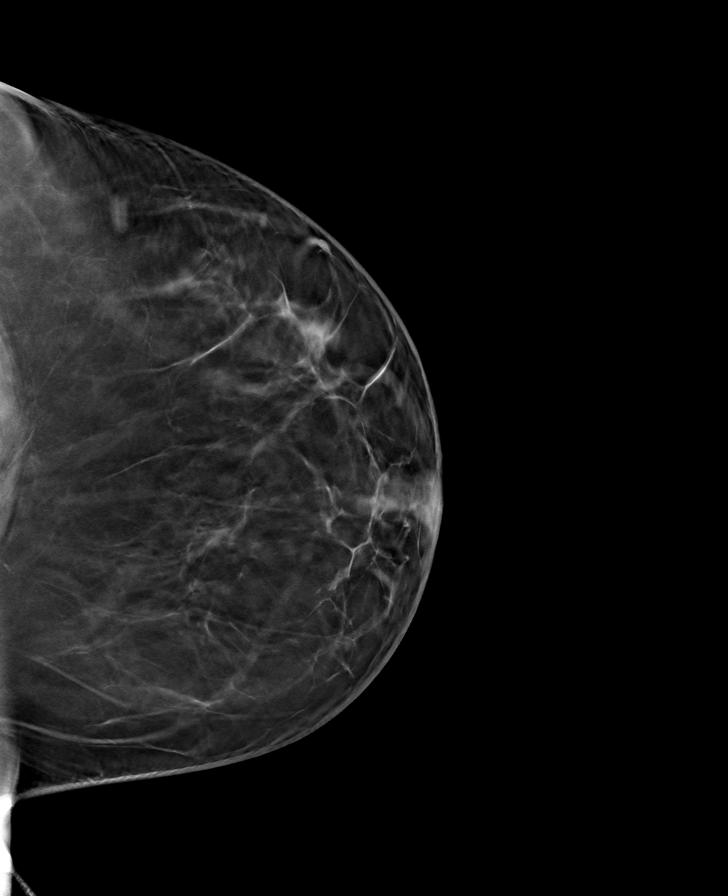

[R CC tomo · tomo slice 45/90.0]
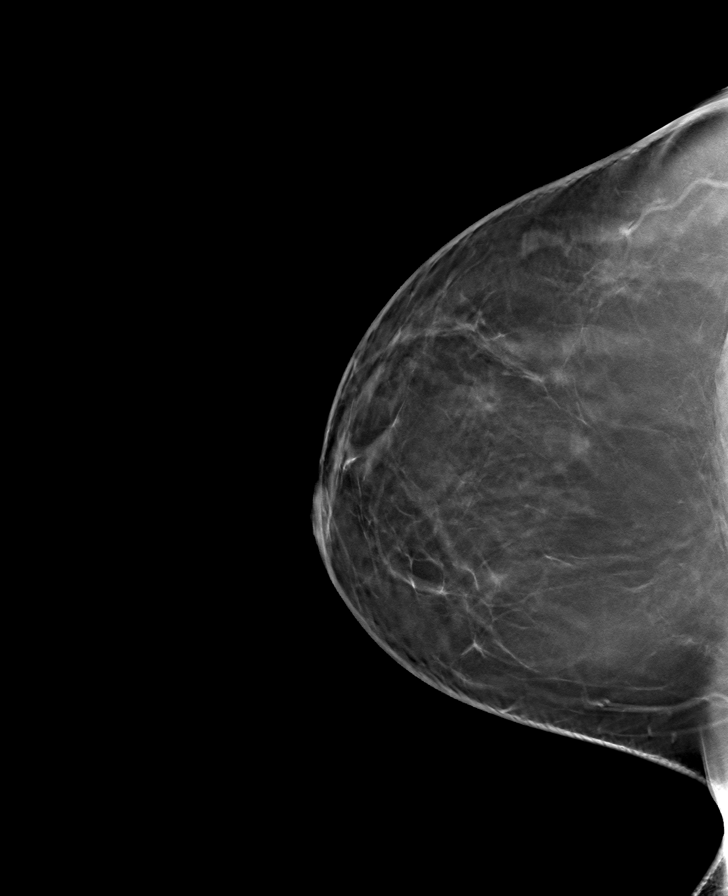

[8 of 24 positions shown; findings below may reference images not displayed]

ACR Breast Density Category b: There are scattered areas of
fibroglandular density.
FINDINGS: There are no findings suspicious for malignancy. Images were
processed with CAD.
IMPRESSION: No mammographic evidence of malignancy. A result letter of this
screening mammogram will be mailed directly to the patient.

RECOMMENDATION:
Screening mammogram in one year. (Code:[id number])

BI-RADS CATEGORY  1: Negative.

## 2022-04-20 ENCOUNTER — Other Ambulatory Visit: Payer: Self-pay | Admitting: Family Medicine

## 2022-04-20 DIAGNOSIS — Z1231 Encounter for screening mammogram for malignant neoplasm of breast: Secondary | ICD-10-CM

## 2022-05-09 ENCOUNTER — Other Ambulatory Visit: Payer: Self-pay | Admitting: Family Medicine

## 2022-05-27 ENCOUNTER — Ambulatory Visit (INDEPENDENT_AMBULATORY_CARE_PROVIDER_SITE_OTHER): Payer: BC Managed Care – PPO

## 2022-05-27 DIAGNOSIS — Z1231 Encounter for screening mammogram for malignant neoplasm of breast: Secondary | ICD-10-CM | POA: Diagnosis not present

## 2022-05-28 DIAGNOSIS — Z1231 Encounter for screening mammogram for malignant neoplasm of breast: Secondary | ICD-10-CM

## 2022-05-28 NOTE — Progress Notes (Signed)
Please call patient. Normal mammogram.  Repeat in 1 year.  

## 2022-06-11 ENCOUNTER — Encounter: Payer: Self-pay | Admitting: Family Medicine

## 2022-06-11 ENCOUNTER — Ambulatory Visit (INDEPENDENT_AMBULATORY_CARE_PROVIDER_SITE_OTHER): Payer: BC Managed Care – PPO | Admitting: Family Medicine

## 2022-06-11 VITALS — BP 137/80 | HR 71 | Ht 60.0 in | Wt 249.0 lb

## 2022-06-11 DIAGNOSIS — Z0000 Encounter for general adult medical examination without abnormal findings: Secondary | ICD-10-CM

## 2022-06-11 NOTE — Progress Notes (Signed)
Complete physical exam  Patient: [name]   DOB: 1975/11/07   47 y.o. Female  MRN: [id number]  Subjective:    Chief Complaint  Patient presents with   Annual Exam    ADRAIN Rose is a 47 y.o. female who presents today for a complete physical exam. She reports consuming a general diet. The patient does not participate in regular exercise at present. She generally feels well. She reports sleeping fairly well. She does not have additional problems to discuss today.    Most recent fall risk assessment:    06/11/2022    9:00 AM  Fall Risk   Falls in the past year? 0  Number falls in past yr: 0  Injury with Fall? 0  Risk for fall due to : No Fall Risks  Follow up Falls evaluation completed     Most recent depression screenings:    12/08/2021    8:45 AM 06/09/2021    8:08 AM  PHQ 2/9 Scores  PHQ - 2 Score 0 0        Patient Care Team: Hali Marry, MD as PCP - General   Outpatient Medications Prior to Visit  Medication Sig   amLODipine (NORVASC) 10 MG tablet TAKE 1 TABLET BY MOUTH EVERY DAY   metoprolol succinate (TOPROL-XL) 100 MG 24 hr tablet TAKE 1 TABLET BY MOUTH DAILY. TAKE WITH OR IMMEDIATELY FOLLOWING A MEAL.   SODIUM FLUORIDE 5000 PPM 1.1 % PSTE Take by mouth.   spironolactone (ALDACTONE) 25 MG tablet TAKE 1 TABLET BY MOUTH EVERY DAY   No facility-administered medications prior to visit.    ROS        Objective:     BP 137/80   Pulse 71   Ht 5' (1.524 m)   Wt 249 lb (112.9 kg)   LMP 05/15/2022 (Approximate)   SpO2 99%   BMI 48.63 kg/m    Physical Exam Vitals and nursing note reviewed.  Constitutional:      Appearance: Normal appearance. She is well-developed.  HENT:     Head: Normocephalic and atraumatic.     Right Ear: Tympanic membrane, ear canal and external ear normal.     Left Ear: Tympanic membrane, ear canal and external ear normal.     Nose: Nose normal.     Mouth/Throat:     Pharynx: Oropharynx is clear.  Eyes:      Conjunctiva/sclera: Conjunctivae normal.     Pupils: Pupils are equal, round, and reactive to light.  Neck:     Thyroid: No thyromegaly.  Cardiovascular:     Rate and Rhythm: Normal rate and regular rhythm.     Heart sounds: Normal heart sounds.  Pulmonary:     Effort: Pulmonary effort is normal.     Breath sounds: Normal breath sounds. No wheezing.  Abdominal:     General: Bowel sounds are normal.     Palpations: Abdomen is soft.  Musculoskeletal:     Cervical back: Neck supple.  Lymphadenopathy:     Cervical: No cervical adenopathy.  Skin:    General: Skin is warm and dry.  Neurological:     Mental Status: She is alert and oriented to person, place, and time.  Psychiatric:        Mood and Affect: Mood normal.        Behavior: Behavior normal.      No results found for any visits on 06/11/22.     Assessment & Plan:  Routine Health Maintenance and Physical Exam  Immunization History  Administered Date(s) Administered   Influenza Inj Mdck Quad Pf 03/11/2019   Influenza Split 02/23/2012   Influenza Whole 03/26/2009, 02/22/2013   Influenza,inj,Quad PF,6+ Mos 04/13/2018, 01/23/2022   Influenza-Unspecified 02/27/2014, 02/25/2015, 03/14/2016, 03/11/2019, 02/24/2020, 02/22/2021   Moderna SARS-COV2 Booster Vaccination 03/25/2021   Moderna Sars-Covid-2 Vaccination 07/27/2019, 08/24/2019, 03/25/2020, 01/23/2022   Td 03/26/2008   Tdap 04/13/2018    Health Maintenance  Topic Date Due   COVID-19 Vaccine (6 - 2023-24 season) 03/20/2022   PAP SMEAR-Modifier  06/18/2025   DTaP/Tdap/Td (3 - Td or Tdap) 04/13/2028   COLONOSCOPY (Pts 45-72yrs Insurance coverage will need to be confirmed)  12/10/2031   INFLUENZA VACCINE  Completed   Hepatitis C Screening  Completed   HIV Screening  Completed   Pneumococcal Vaccine 43-81 Years old  Aged Out   HPV VACCINES  Aged Out    Discussed health benefits of physical activity, and encouraged her to engage in regular exercise  appropriate for her age and condition.  Problem List Items Addressed This Visit   None Visit Diagnoses     Wellness examination    -  Primary   Relevant Orders   Lipid Panel w/reflex Direct LDL   COMPLETE METABOLIC PANEL WITH GFR   CBC   Hemoglobin A1c       Keep up a regular exercise program and make sure you are eating a healthy diet Try to eat 4 servings of dairy a day, or if you are lactose intolerant take a calcium with vitamin D daily.  Your vaccines are up to date.   Return in about 6 months (around 12/10/2022) for Hypertension.     Shari Lecher, MD

## 2022-06-12 LAB — CBC
HCT: 44.4 % (ref 35.0–45.0)
Hemoglobin: 15 g/dL (ref 11.7–15.5)
MCH: 30.1 pg (ref 27.0–33.0)
MCHC: 33.8 g/dL (ref 32.0–36.0)
MCV: 89.2 fL (ref 80.0–100.0)
MPV: 11.1 fL (ref 7.5–12.5)
Platelets: 226 10*3/uL (ref 140–400)
RBC: 4.98 10*6/uL (ref 3.80–5.10)
RDW: 13 % (ref 11.0–15.0)
WBC: 5.5 10*3/uL (ref 3.8–10.8)

## 2022-06-12 LAB — COMPLETE METABOLIC PANEL WITH GFR
AG Ratio: 1.6 (calc) (ref 1.0–2.5)
ALT: 23 U/L (ref 6–29)
AST: 22 U/L (ref 10–35)
Albumin: 4.4 g/dL (ref 3.6–5.1)
Alkaline phosphatase (APISO): 56 U/L (ref 31–125)
BUN: 12 mg/dL (ref 7–25)
CO2: 24 mmol/L (ref 20–32)
Calcium: 9.5 mg/dL (ref 8.6–10.2)
Chloride: 102 mmol/L (ref 98–110)
Creat: 0.71 mg/dL (ref 0.50–0.99)
Globulin: 2.8 g/dL (calc) (ref 1.9–3.7)
Glucose, Bld: 100 mg/dL — ABNORMAL HIGH (ref 65–99)
Potassium: 4 mmol/L (ref 3.5–5.3)
Sodium: 140 mmol/L (ref 135–146)
Total Bilirubin: 0.6 mg/dL (ref 0.2–1.2)
Total Protein: 7.2 g/dL (ref 6.1–8.1)
eGFR: 107 mL/min/{1.73_m2} (ref >=60)

## 2022-06-12 LAB — LIPID PANEL W/REFLEX DIRECT LDL
Cholesterol: 186 mg/dL (ref <200)
HDL: 51 mg/dL (ref >=50)
LDL Cholesterol (Calc): 111 mg/dL (calc) — ABNORMAL HIGH
Non-HDL Cholesterol (Calc): 135 mg/dL (calc) — ABNORMAL HIGH (ref <130)
Total CHOL/HDL Ratio: 3.6 (calc) (ref <5.0)
Triglycerides: 129 mg/dL (ref <150)

## 2022-06-12 LAB — HEMOGLOBIN A1C
Hgb A1c MFr Bld: 6.2 % of total Hgb — ABNORMAL HIGH (ref <5.7)
Mean Plasma Glucose: 131 mg/dL
eAG (mmol/L): 7.3 mmol/L

## 2022-06-12 NOTE — Progress Notes (Signed)
Hi Zeniya, LDL cholesterol looks much better this year back down to 111 which is awesome.  Panel is normal.  A1c is up to 6.2.  It was 5.4 last year.  So it is back into the prediabetes range to just continue to work on those good changes that you have been making.  And we will plan to recheck it again in 6 months.  Your blood count is normal.

## 2022-06-23 ENCOUNTER — Other Ambulatory Visit: Payer: Self-pay | Admitting: Family Medicine

## 2022-06-23 DIAGNOSIS — I10 Essential (primary) hypertension: Secondary | ICD-10-CM

## 2022-07-04 ENCOUNTER — Other Ambulatory Visit: Payer: Self-pay | Admitting: Family Medicine

## 2022-07-04 DIAGNOSIS — I10 Essential (primary) hypertension: Secondary | ICD-10-CM

## 2022-10-22 ENCOUNTER — Ambulatory Visit: Payer: BC Managed Care – PPO | Admitting: Sports Medicine

## 2022-10-22 ENCOUNTER — Ambulatory Visit (INDEPENDENT_AMBULATORY_CARE_PROVIDER_SITE_OTHER): Payer: BC Managed Care – PPO

## 2022-10-22 ENCOUNTER — Encounter: Payer: Self-pay | Admitting: Sports Medicine

## 2022-10-22 DIAGNOSIS — M25562 Pain in left knee: Secondary | ICD-10-CM

## 2022-10-22 DIAGNOSIS — G8929 Other chronic pain: Secondary | ICD-10-CM | POA: Diagnosis not present

## 2022-10-22 DIAGNOSIS — Z09 Encounter for follow-up examination after completed treatment for conditions other than malignant neoplasm: Secondary | ICD-10-CM

## 2022-10-22 MED ORDER — MELOXICAM 15 MG PO TABS: ORAL_TABLET | ORAL | 3 refills | Status: DC

## 2022-10-22 NOTE — Progress Notes (Signed)
    Procedures performed today:    None.  Independent interpretation of notes and tests performed by another provider:   None.  Brief History, Exam, Impression, and Recommendations:    Chronic pain of left knee Pleasant 47 year old female, chronic pain left knee medial and lateral joint line worse with weightbearing with moderate gelling, no mechanical symptoms. On exam she has mild effusion, tenderness medial joint line, negative McMurray's sign, no pain with terminal flexion, nonpainful patella compression. Suspect early osteoarthritis, adding x-rays, meloxicam, formal physical therapy, return to see me in 6 weeks.    ____________________________________________ Ihor Austin. Benjamin Stain, M.D., ABFM., CAQSM., AME. Primary Care and Sports Medicine [facility]  Adjunct Professor of Family Medicine  [employer]  Restaurant manager, fast food

## 2022-10-22 NOTE — Assessment & Plan Note (Signed)
Pleasant 47 year old female, chronic pain left knee medial and lateral joint line worse with weightbearing with moderate gelling, no mechanical symptoms. On exam she has mild effusion, tenderness medial joint line, negative McMurray's sign, no pain with terminal flexion, nonpainful patella compression. Suspect early osteoarthritis, adding x-rays, meloxicam, formal physical therapy, return to see me in 6 weeks.

## 2022-11-02 ENCOUNTER — Other Ambulatory Visit: Payer: Self-pay | Admitting: Family Medicine

## 2022-11-05 ENCOUNTER — Encounter: Payer: Self-pay | Admitting: Family Medicine

## 2022-11-05 ENCOUNTER — Ambulatory Visit: Payer: BC Managed Care – PPO | Attending: Sports Medicine | Admitting: Rehabilitative and Restorative Service Providers"

## 2022-11-05 ENCOUNTER — Encounter: Payer: Self-pay | Admitting: Rehabilitative and Restorative Service Providers"

## 2022-11-05 ENCOUNTER — Other Ambulatory Visit: Payer: Self-pay

## 2022-11-05 DIAGNOSIS — R29898 Other symptoms and signs involving the musculoskeletal system: Secondary | ICD-10-CM | POA: Insufficient documentation

## 2022-11-05 DIAGNOSIS — M25562 Pain in left knee: Secondary | ICD-10-CM | POA: Diagnosis present

## 2022-11-05 DIAGNOSIS — G8929 Other chronic pain: Secondary | ICD-10-CM | POA: Diagnosis present

## 2022-11-05 DIAGNOSIS — M6281 Muscle weakness (generalized): Secondary | ICD-10-CM | POA: Insufficient documentation

## 2022-11-05 DIAGNOSIS — R2689 Other abnormalities of gait and mobility: Secondary | ICD-10-CM | POA: Insufficient documentation

## 2022-11-05 NOTE — Therapy (Signed)
OUTPATIENT PHYSICAL THERAPY LOWER EXTREMITY EVALUATION   Patient Name: [name] MRN: [id number] DOB:04-Jul-1975, 47 y.o., female Today's Date: 11/05/2022  END OF SESSION:   Past Medical History:  Diagnosis Date   Hypertension    IBS (irritable bowel syndrome)    PCOS (polycystic ovarian syndrome)    Past Surgical History:  Procedure Laterality Date   CESAREAN SECTION  2008   CESAREAN SECTION  02/02/2011   Procedure: CESAREAN SECTION;  Surgeon: Jeani Hawking, MD;  Location: [facility];  Service: Gynecology;  Laterality: N/A;   ESSURE TUBAL LIGATION  03/26/11   Patient Active Problem List   Diagnosis Date Noted   Chronic pain of left knee 10/22/2022   Abnormal CT of the abdomen 12/08/2021   Snoring 12/22/2019   IFG (impaired fasting glucose) 10/01/2017   Family history of pancreatic cancer 12/05/2015   Essential hypertension 06/24/2015   S/P cesarean section 02/02/2011   POLYCYSTIC OVARIAN DISEASE 03/26/2008   Hyperlipidemia 03/26/2008   COMMON MIGRAINE 03/26/2008   IBS 03/26/2008    PCP: Dr Nani Gasser  REFERRING PROVIDER: Dr Rodney Langton   REFERRING DIAG: Chronic Lt knee pain   THERAPY DIAG:  No diagnosis found.  Rationale for Evaluation and Treatment: Rehabilitation  ONSET DATE: 07/24/22  SUBJECTIVE:   SUBJECTIVE STATEMENT: Patient reports that she went to [facility] 2019 and developed bilat plantar fasciitis with resolution of plantar fasciitis but she has severe pain in the Lt knee since 3/24. Pain is primarily around the knee cap and on both sides of the knees. She feels soreness in the back of calf and knee at times.   PERTINENT HISTORY: HTN; headaches; bilat plantar fasciitis x 5 yrs now resolved  PAIN:  Are you having pain? Yes: NPRS scale: 5/10 Pain location: Lt knee  Pain description: dull  Aggravating factors: walking; steps; squatting; bending; lifting   Relieving factors: meds   PRECAUTIONS: Knee  WEIGHT BEARING RESTRICTIONS:  No  FALLS:  Has patient fallen in last 6 months? Yes. Number of falls 1 - no injury  LIVING ENVIRONMENT: Lives with: lives with their spouse Lives in: House/apartment Stairs: Yes: External: 1 steps; none Has following equipment at home: None  OCCUPATION: Comptroller - some walking; desk work and up/down; Building control surveyor x 17 yrs   Household chores; child care   PLOF: Independent  PATIENT GOALS: get rid of the pain and return to walking   NEXT MD VISIT: 12/02/22  OBJECTIVE:   DIAGNOSTIC FINDINGS: xray 10/22/22 - No evidence of fracture, dislocation, or joint effusion. No evidence of arthropathy or other focal bone abnormality. Soft tissues are unremarkable.  PATIENT SURVEYS:  FOTO 44; goal 61  COGNITION: Overall cognitive status: Within functional limits for tasks assessed     SENSATION: WFL  EDEMA:  Minor edema on an intermittent basis   Balance:   R 8 sec; L 6 sec   MUSCLE LENGTH: Hamstrings: Right 90 deg; Left 80 deg Thomas test: tight hip flexors  Quad prone flexion: R 90; L 80  POSTURE: rounded shoulders, forward head, flexed trunk , and L knee flexed/R knee hyperextended   PALPATION: Muscular tightness lateral distal quad to lateral proximal calf   LOWER EXTREMITY ROM:  Active ROM Right eval Left eval  Hip flexion Tight  Tight   Hip extension Tight  Tight   Hip abduction    Hip adduction    Hip internal rotation    Hip external rotation    Knee flexion 112 103  Knee  extension 5 -9  Ankle dorsiflexion Tight  Tight   Ankle plantarflexion    Ankle inversion    Ankle eversion     (Blank rows = not tested)  LOWER EXTREMITY MMT:  MMT Right eval Left eval  Hip flexion 5 4  Hip extension 4 4-  Hip abduction 5 4  Hip adduction    Hip internal rotation    Hip external rotation    Knee flexion 5 4+  Knee extension 5 4 painful   Ankle dorsiflexion    Ankle plantarflexion 4 3 painful  Ankle inversion    Ankle eversion     (Blank rows = not  tested)  LOWER EXTREMITY SPECIAL TESTS:  Hip special tests: Piriformis test: positive tight L  FUNCTIONAL TESTS:  5 times sit to stand: 15.89 sec with pain in L knee   GAIT: Distance walked: 40 Assistive device utilized: None Level of assistance: Complete Independence Comments: antalgic gait L in Wt bearing L    [facility] Adult PT Treatment:                                                DATE: 11/05/22 Therapeutic Exercise: Prone  Quad stretch 30 sec x 2 Quad set knee ext 5 sec x 5 Supine  HS stretch 30 sec x 2  ITB stretch w/ strap 30 sec x 2  Manual Therapy: Instructed in transverse friction massage proximal lateral pole of patella L knee 1 min followed by ice massage 1-2 times/day  Modalities: Ice at home  Self Care: Avoid standing with knees hyperextended  Use of topical analgesic for pain management     PATIENT EDUCATION:  Education details: POC; HEP  Person educated: Patient Education method: Explanation, Demonstration, Tactile cues, Verbal cues, and Handouts Education comprehension: verbalized understanding, returned demonstration, verbal cues required, tactile cues required, and needs further education  HOME EXERCISE PROGRAM: Access Code: [email or web address] URL: [email or web address] Date: 11/05/2022 Prepared by: Corlis Leak  Exercises - Prone Quadriceps Stretch with Strap  - 2 x daily - 7 x weekly - 1 sets - 3 reps - 30 sec  hold - Prone Quadriceps Set  - 2 x daily - 7 x weekly - 1-2 sets - 10 reps - 5-10 sec  hold - Hooklying Hamstring Stretch with Strap  - 2 x daily - 7 x weekly - 1 sets - 3 reps - 30 sec  hold - Supine ITB Stretch with Strap  - 2 x daily - 7 x weekly - 1 sets - 3 reps - 30 sec  hold - Supine Quad Set  - 2 x daily - 7 x weekly - 1 sets - 10 reps - 3 sec  hold - Straight Leg Raise with External Rotation  - 2 x daily - 7 x weekly - 1 sets - 10 reps - 3-5 sec  hold  Patient Education - Trigger Point Dry Needling  ASSESSMENT:  CLINICAL  IMPRESSION: Patient is a 47 y.o. female who was seen today for physical therapy evaluation and treatment for chronic L knee pain. She has a history of bilat plantar fasciitis which resolved after 5 years but then she noticed L knee pain starting 3/24. Pain has persisted, some days more noticeable than others. Patient present with poor posture and alignment; antalgic gait; decreased ROM/mobility L > R LE; LE weakness; pain limiting functional  activity level   OBJECTIVE IMPAIRMENTS: Abnormal gait, decreased activity tolerance, decreased balance, decreased endurance, decreased mobility, decreased ROM, decreased strength, increased fascial restrictions, impaired flexibility, improper body mechanics, postural dysfunction, obesity, and pain.   ACTIVITY LIMITATIONS: carrying, lifting, bending, sitting, standing, squatting, stairs, transfers, bed mobility, and locomotion level  PARTICIPATION LIMITATIONS: meal prep, cleaning, laundry, shopping, community activity, occupation, and yard work  PERSONAL FACTORS: Fitness, Past/current experiences, Time since onset of injury/illness/exacerbation, and comorbidities: bilat plantar fasciitis; obesity; sedentary lifestyle are also affecting patient's functional outcome.   REHAB POTENTIAL: Good  CLINICAL DECISION MAKING: Stable/uncomplicated  EVALUATION COMPLEXITY: Low   GOALS: Goals reviewed with patient? Yes  SHORT TERM GOALS: Target date: 12/17/2022  Independent in HEP  Baseline: Goal status: INITIAL  2.  L knee ROM 0 deg extension to 110 deg flexion  Baseline:  Goal status: INITIAL   LONG TERM GOALS: Target date: 01/28/2023   Decrease L knee pain 75=100% allowing patient to return to normal functional activities  Baseline:  Goal status: INITIAL  2.  4+/5 to 5/5 strength bilat LE's  Baseline:  Goal status: INITIAL  3.  Ambulation with improved gait pattern without antalgic gait pattern  Baseline:  Goal status: INITIAL  4.  Patient reports  return to walking program for fitness walking for 15-20 min with no increase in pain L knee > 1-2/10  Baseline:  Goal status: INITIAL  5.  Independent in HEP including aquatic therapy program as indicated  Baseline:  Goal status: INITIAL  6.  Improve functional limitation score to 65 Baseline: 47 Goal status: INITIAL   PLAN:  PT FREQUENCY: 2x/week  PT DURATION: 12 weeks  PLANNED INTERVENTIONS: Therapeutic exercises, Therapeutic activity, Neuromuscular re-education, Balance training, Gait training, Patient/Family education, Self Care, Joint mobilization, Stair training, Aquatic Therapy, Dry Needling, Electrical stimulation, Cryotherapy, Moist heat, Taping, Ultrasound, Ionotophoresis 4mg /ml Dexamethasone, Manual therapy, and Re-evaluation  PLAN FOR NEXT SESSION: review and progress exercise; manual work, DN, modalities as indicated  Add gastroc/soleus stretches; progress with strengthening for medial quad, stretch lateral quad/ITB, manual work lateral, distal quad to release muscular tightness at patella; consider taping and/or DN for lateral quad tightness    Samin Milke P Andrianna Manalang, PT 11/05/2022, 2:08 PM

## 2022-11-09 ENCOUNTER — Ambulatory Visit: Payer: BC Managed Care – PPO | Admitting: Rehabilitative and Restorative Service Providers"

## 2022-11-09 ENCOUNTER — Encounter: Payer: Self-pay | Admitting: Rehabilitative and Restorative Service Providers"

## 2022-11-09 DIAGNOSIS — M25562 Pain in left knee: Secondary | ICD-10-CM | POA: Diagnosis not present

## 2022-11-09 DIAGNOSIS — R29898 Other symptoms and signs involving the musculoskeletal system: Secondary | ICD-10-CM

## 2022-11-09 DIAGNOSIS — R2689 Other abnormalities of gait and mobility: Secondary | ICD-10-CM

## 2022-11-09 DIAGNOSIS — G8929 Other chronic pain: Secondary | ICD-10-CM

## 2022-11-09 DIAGNOSIS — M6281 Muscle weakness (generalized): Secondary | ICD-10-CM

## 2022-11-09 NOTE — Therapy (Signed)
OUTPATIENT PHYSICAL THERAPY LOWER EXTREMITY TREATMENT    Patient Name: [name] MRN: [id number] DOB:[birth year], 47 y.o., female Today's Date: 11/09/2022  END OF SESSION:  PT End of Session - 11/09/22 1544     Visit Number 2    Number of Visits 24    Date for PT Re-Evaluation 01/28/23    Authorization Type state health plan 70/30 copay    PT Start Time 1537    PT Stop Time 1625    PT Time Calculation (min) 48 min    Activity Tolerance Patient tolerated treatment well             Past Medical History:  Diagnosis Date   Hypertension    IBS (irritable bowel syndrome)    PCOS (polycystic ovarian syndrome)    Past Surgical History:  Procedure Laterality Date   CESAREAN SECTION  2008   CESAREAN SECTION  02/02/2011   Procedure: CESAREAN SECTION;  Surgeon: Jeani Hawking, MD;  Location: [facility];  Service: Gynecology;  Laterality: N/A;   ESSURE TUBAL LIGATION  03/26/11   Patient Active Problem List   Diagnosis Date Noted   Chronic pain of left knee 10/22/2022   Abnormal CT of the abdomen 12/08/2021   Snoring 12/22/2019   IFG (impaired fasting glucose) 10/01/2017   Family history of pancreatic cancer 12/05/2015   Essential hypertension 06/24/2015   S/P cesarean section 02/02/2011   POLYCYSTIC OVARIAN DISEASE 03/26/2008   Hyperlipidemia 03/26/2008   COMMON MIGRAINE 03/26/2008   IBS 03/26/2008    PCP: Dr Nani Gasser  REFERRING PROVIDER: Dr Rodney Langton   REFERRING DIAG: Chronic Lt knee pain   THERAPY DIAG:  Chronic pain of left knee  Other symptoms and signs involving the musculoskeletal system  Muscle weakness (generalized)  Other abnormalities of gait and mobility  Rationale for Evaluation and Treatment: Rehabilitation  ONSET DATE: 07/24/22  SUBJECTIVE:   SUBJECTIVE STATEMENT: Patient reports that she has been working on her exercises at home and has a strap. She can tell that she is walking with less bend in her knee and the walking  does feel better. Could walk up and down a hill when she took her son to church camp yesterday. Using biofreeze at home which helps with pain.   EVAL: Patient reports that she went to [facility] 2019 and developed bilat plantar fasciitis with resolution of plantar fasciitis but she has severe pain in the Lt knee since 3/24. Pain is primarily around the knee cap and on both sides of the knees. She feels soreness in the back of calf and knee at times.   PERTINENT HISTORY: HTN; headaches; bilat plantar fasciitis x 5 yrs now resolved  PAIN:  Are you having pain? Yes: NPRS scale: 4/10 Pain location: Lt knee  Pain description: dull  Aggravating factors: walking; steps; squatting; bending; lifting   Relieving factors: meds   PRECAUTIONS: Knee  WEIGHT BEARING RESTRICTIONS: No  FALLS:  Has patient fallen in last 6 months? Yes. Number of falls 1 - no injury  LIVING ENVIRONMENT: Lives with: lives with their spouse Lives in: House/apartment Stairs: Yes: External: 1 steps; none Has following equipment at home: None  OCCUPATION: Comptroller - some walking; desk work and up/down; Building control surveyor x 17 yrs   Household chores; child care   PATIENT GOALS: get rid of the pain and return to walking   NEXT MD VISIT: 12/02/22  OBJECTIVE:   DIAGNOSTIC FINDINGS: xray 10/22/22 - No evidence of fracture, dislocation, or joint effusion.  No evidence of arthropathy or other focal bone abnormality. Soft tissues are unremarkable.  PATIENT SURVEYS:  FOTO 47; goal 65  EDEMA:  Minor edema on an intermittent basis   Balance:   R 8 sec; L 6 sec   MUSCLE LENGTH: Hamstrings: Right 90 deg; Left 80 deg Thomas test: tight hip flexors  Quad prone flexion: R 90; L 80  POSTURE: rounded shoulders, forward head, flexed trunk , and L knee flexed/R knee hyperextended   PALPATION: Muscular tightness lateral distal quad to lateral proximal calf   LOWER EXTREMITY ROM:  Active ROM Right eval Left eval  Hip  flexion Tight  Tight   Hip extension Tight  Tight   Hip abduction    Hip adduction    Hip internal rotation    Hip external rotation    Knee flexion 112 103  Knee extension 5 -9  Ankle dorsiflexion Tight  Tight   Ankle plantarflexion    Ankle inversion    Ankle eversion     (Blank rows = not tested)  LOWER EXTREMITY MMT:  MMT Right eval Left eval  Hip flexion 5 4  Hip extension 4 4-  Hip abduction 5 4  Hip adduction    Hip internal rotation    Hip external rotation    Knee flexion 5 4+  Knee extension 5 4 painful   Ankle dorsiflexion    Ankle plantarflexion 4 3 painful  Ankle inversion    Ankle eversion     (Blank rows = not tested)  LOWER EXTREMITY SPECIAL TESTS:  Hip special tests: Piriformis test: positive tight L  FUNCTIONAL TESTS:  5 times sit to stand: 15.89 sec with pain in L knee   GAIT: Distance walked: 40 Assistive device utilized: None Level of assistance: Complete Independence Comments: antalgic gait L in Wt bearing L    [facility] Adult PT Treatment:                                                DATE: 11/09/22 Therapeutic Exercise: Standing  Gastroc stretch 30 sec x 2 R/L Soleus stretch 30 sec x 2 R/L Wall squat 5 sec x 10  Single leg stance 10 sec x 3 R/L  Prone  Quad stretch 30 sec x 2 Glut set 5 sec x 10 Supine  HS stretch 30 sec x 2  ITB stretch w/ strap 30 sec x 2  Hip abduction isometric green TB 3 sec x 10 alternating R/L  Sitting  Hamstring stretch 30 sec x 2 R/L Manual Therapy: Reviewed in transverse friction massage proximal lateral pole of patella L knee 1 min followed by ice massage 1-2 times/day  Modalities: Ice at home  Self Care: Avoid standing with knees hyperextended  Use of topical analgesic for pain management   [facility] Adult PT Treatment:                                                DATE: 11/05/22 Therapeutic Exercise: Prone  Quad stretch 30 sec x 2 Quad set knee ext 5 sec x 5 Supine  HS stretch 30 sec x 2  ITB  stretch w/ strap 30 sec x 2  Manual Therapy: Instructed in transverse friction massage  proximal lateral pole of patella L knee 1 min followed by ice massage 1-2 times/day  Modalities: Ice at home  Self Care: Avoid standing with knees hyperextended  Use of topical analgesic for pain management     PATIENT EDUCATION:  Education details: POC; HEP  Person educated: Patient Education method: Explanation, Demonstration, Tactile cues, Verbal cues, and Handouts Education comprehension: verbalized understanding, returned demonstration, verbal cues required, tactile cues required, and needs further education  HOME EXERCISE PROGRAM: Access Code: [email or web address] URL: [email or web address] Date: 11/05/2022 Prepared by: Corlis Leak  Exercises - Prone Quadriceps Stretch with Strap  - 2 x daily - 7 x weekly - 1 sets - 3 reps - 30 sec  hold - Prone Quadriceps Set  - 2 x daily - 7 x weekly - 1-2 sets - 10 reps - 5-10 sec  hold - Hooklying Hamstring Stretch with Strap  - 2 x daily - 7 x weekly - 1 sets - 3 reps - 30 sec  hold - Supine ITB Stretch with Strap  - 2 x daily - 7 x weekly - 1 sets - 3 reps - 30 sec  hold - Supine Quad Set  - 2 x daily - 7 x weekly - 1 sets - 10 reps - 3 sec  hold - Straight Leg Raise with External Rotation  - 2 x daily - 7 x weekly - 1 sets - 10 reps - 3-5 sec  hold  Patient Education - Trigger Point Dry Needling  ASSESSMENT:  CLINICAL IMPRESSION: Patient reports decreased pain and improved ability to ambulate with knees straighter. Reviewed and progressed exercises for stretching and strengthening. Tolerated treatment well.   OBJECTIVE IMPAIRMENTS: chronic L knee pain. She has a history of bilat plantar fasciitis which resolved after 5 years but then she noticed L knee pain starting 3/24. Pain has persisted, some days more noticeable than others. Patient present with poor posture and alignment; antalgic gait; decreased ROM/mobility L > R LE; LE weakness; pain  limiting functional activity level; abnormal gait, decreased activity tolerance, decreased balance, decreased endurance, decreased mobility, decreased ROM, decreased strength, increased fascial restrictions, impaired flexibility, improper body mechanics, postural dysfunction, obesity, and pain.      GOALS: Goals reviewed with patient? Yes  SHORT TERM GOALS: Target date: 12/17/2022  Independent in HEP  Baseline: Goal status: INITIAL  2.  L knee ROM 0 deg extension to 110 deg flexion  Baseline:  Goal status: INITIAL   LONG TERM GOALS: Target date: 01/28/2023   Decrease L knee pain 75=100% allowing patient to return to normal functional activities  Baseline:  Goal status: INITIAL  2.  4+/5 to 5/5 strength bilat LE's  Baseline:  Goal status: INITIAL  3.  Ambulation with improved gait pattern without antalgic gait pattern  Baseline:  Goal status: INITIAL  4.  Patient reports return to walking program for fitness walking for 15-20 min with no increase in pain L knee > 1-2/10  Baseline:  Goal status: INITIAL  5.  Independent in HEP including aquatic therapy program as indicated  Baseline:  Goal status: INITIAL  6.  Improve functional limitation score to 65 Baseline: 47 Goal status: INITIAL   PLAN:  PT FREQUENCY: 2x/week  PT DURATION: 12 weeks  PLANNED INTERVENTIONS: Therapeutic exercises, Therapeutic activity, Neuromuscular re-education, Balance training, Gait training, Patient/Family education, Self Care, Joint mobilization, Stair training, Aquatic Therapy, Dry Needling, Electrical stimulation, Cryotherapy, Moist heat, Taping, Ultrasound, Ionotophoresis 4mg /ml Dexamethasone, Manual therapy, and Re-evaluation  PLAN FOR NEXT SESSION: review  and progress exercise; manual work, DN, modalities as indicated  Add gastroc/soleus stretches; progress with strengthening for medial quad, stretch lateral quad/ITB, manual work lateral, distal quad to release muscular tightness at  patella; consider taping and/or DN for lateral quad tightness    Alexsander Cavins P Keziyah Kneale, PT 11/09/2022, 3:45 PM

## 2022-11-11 ENCOUNTER — Encounter: Payer: Self-pay | Admitting: Rehabilitative and Restorative Service Providers"

## 2022-11-11 ENCOUNTER — Ambulatory Visit: Payer: BC Managed Care – PPO | Admitting: Rehabilitative and Restorative Service Providers"

## 2022-11-11 DIAGNOSIS — R2689 Other abnormalities of gait and mobility: Secondary | ICD-10-CM

## 2022-11-11 DIAGNOSIS — M6281 Muscle weakness (generalized): Secondary | ICD-10-CM

## 2022-11-11 DIAGNOSIS — R29898 Other symptoms and signs involving the musculoskeletal system: Secondary | ICD-10-CM

## 2022-11-11 DIAGNOSIS — G8929 Other chronic pain: Secondary | ICD-10-CM

## 2022-11-11 DIAGNOSIS — M25562 Pain in left knee: Secondary | ICD-10-CM | POA: Diagnosis not present

## 2022-11-11 NOTE — Therapy (Signed)
OUTPATIENT PHYSICAL THERAPY LOWER EXTREMITY TREATMENT    Patient Name: BRION MARTORANA MRN: [id number] DOB:[birth year], 47 y.o., female Today's Date: 11/11/2022  END OF SESSION:  PT End of Session - 11/11/22 1541     Visit Number 3    Number of Visits 24    Date for PT Re-Evaluation 01/28/23    Authorization Type state health plan 70/30 copay    PT Start Time 1535    PT Stop Time 1616    PT Time Calculation (min) 41 min    Activity Tolerance Patient tolerated treatment well             Past Medical History:  Diagnosis Date   Hypertension    IBS (irritable bowel syndrome)    PCOS (polycystic ovarian syndrome)    Past Surgical History:  Procedure Laterality Date   CESAREAN SECTION  2008   CESAREAN SECTION  02/02/2011   Procedure: CESAREAN SECTION;  Surgeon: Jeani Hawking, MD;  Location: [facility];  Service: Gynecology;  Laterality: N/A;   ESSURE TUBAL LIGATION  03/26/11   Patient Active Problem List   Diagnosis Date Noted   Chronic pain of left knee 10/22/2022   Abnormal CT of the abdomen 12/08/2021   Snoring 12/22/2019   IFG (impaired fasting glucose) 10/01/2017   Family history of pancreatic cancer 12/05/2015   Essential hypertension 06/24/2015   S/P cesarean section 02/02/2011   POLYCYSTIC OVARIAN DISEASE 03/26/2008   Hyperlipidemia 03/26/2008   COMMON MIGRAINE 03/26/2008   IBS 03/26/2008    PCP: Dr Nani Gasser  REFERRING PROVIDER: Dr Rodney Langton   REFERRING DIAG: Chronic Lt knee pain   THERAPY DIAG:  Chronic pain of left knee  Other symptoms and signs involving the musculoskeletal system  Muscle weakness (generalized)  Other abnormalities of gait and mobility  Rationale for Evaluation and Treatment: Rehabilitation  ONSET DATE: 07/24/22  SUBJECTIVE:   SUBJECTIVE STATEMENT: Patient reports increased soreness in the L knee and calf in the past two days. She is not having the sharp pain just some soreness. Soreness is from the  exercises. She that she has been working on her exercises at home and has a strap.   EVAL: Patient reports that she went to [facility] 2019 and developed bilat plantar fasciitis with resolution of plantar fasciitis but she has severe pain in the Lt knee since 3/24. Pain is primarily around the knee cap and on both sides of the knees. She feels soreness in the back of calf and knee at times.   PERTINENT HISTORY: HTN; headaches; bilat plantar fasciitis x 5 yrs now resolved  PAIN:  Are you having pain? Yes: NPRS scale: 5-6/10 Pain location: Lt knee  Pain description: dull  Aggravating factors: walking; steps; squatting; bending; lifting   Relieving factors: meds   PRECAUTIONS: Knee  WEIGHT BEARING RESTRICTIONS: No  FALLS:  Has patient fallen in last 6 months? Yes. Number of falls 1 - no injury  LIVING ENVIRONMENT: Lives with: lives with their spouse Lives in: House/apartment Stairs: Yes: External: 1 steps; none Has following equipment at home: None  OCCUPATION: Comptroller - some walking; desk work and up/down; Building control surveyor x 17 yrs   Household chores; child care   PATIENT GOALS: get rid of the pain and return to walking   NEXT MD VISIT: 12/02/22  OBJECTIVE:   DIAGNOSTIC FINDINGS: xray 10/22/22 - No evidence of fracture, dislocation, or joint effusion. No evidence of arthropathy or other focal bone abnormality. Soft tissues are unremarkable.  PATIENT SURVEYS:  FOTO 47; goal 65  EDEMA:  Minor edema on an intermittent basis   Balance:   R 8 sec; L 6 sec   MUSCLE LENGTH: Hamstrings: Right 90 deg; Left 80 deg Thomas test: tight hip flexors  Quad prone flexion: R 90; L 80  POSTURE: rounded shoulders, forward head, flexed trunk , and L knee flexed/R knee hyperextended   PALPATION: Muscular tightness lateral distal quad to lateral proximal calf   LOWER EXTREMITY ROM:  Active ROM Right eval Left eval  Hip flexion Tight  Tight   Hip extension Tight  Tight   Hip  abduction    Hip adduction    Hip internal rotation    Hip external rotation    Knee flexion 112 103  Knee extension 5 -9  Ankle dorsiflexion Tight  Tight   Ankle plantarflexion    Ankle inversion    Ankle eversion     (Blank rows = not tested)  LOWER EXTREMITY MMT:  MMT Right eval Left eval  Hip flexion 5 4  Hip extension 4 4-  Hip abduction 5 4  Hip adduction    Hip internal rotation    Hip external rotation    Knee flexion 5 4+  Knee extension 5 4 painful   Ankle dorsiflexion    Ankle plantarflexion 4 3 painful  Ankle inversion    Ankle eversion     (Blank rows = not tested)  LOWER EXTREMITY SPECIAL TESTS:  Hip special tests: Piriformis test: positive tight L  FUNCTIONAL TESTS:  5 times sit to stand: 15.89 sec with pain in L knee   GAIT: Distance walked: 40 Assistive device utilized: None Level of assistance: Complete Independence Comments: antalgic gait L in Wt bearing L    [facility] Adult PT Treatment:                                                DATE: 11/11/22 Therapeutic Exercise: Standing  Gastroc stretch 30 sec x 2 R/L Soleus stretch 30 sec x 2 R/L Wall squat 5 sec x 10  Single leg stance 10 sec x 3 R/L  Prone  Quad stretch 30 sec x 2 Glut set 5 sec x 10 Supine  HS stretch 30 sec x 2  ITB stretch w/ strap 30 sec x 2  Alternate windshield wiper in hook lying x 15-20 Hip abduction isometric green TB 3 sec x 10 alternating R/L  Sitting  Hamstring stretch 30 sec x 2 R/L Manual Therapy: IASTM L lateral thigh/quads  Trial of Massage stick for home use Reviewed in transverse friction massage proximal lateral pole of patella L knee 1 min followed by ice massage 1-2 times/day  Modalities: Ice at home  Self Care: Avoid standing with knees hyperextended  Use of topical analgesic for pain management   [facility] Adult PT Treatment:                                                DATE: 11/09/22 Therapeutic Exercise: Standing  Gastroc stretch 30 sec x 2  R/L Soleus stretch 30 sec x 2 R/L Wall squat 5 sec x 10  Single leg stance 10 sec x 3 R/L  Prone  Quad  stretch 30 sec x 2 Glut set 5 sec x 10 Supine  HS stretch 30 sec x 2  ITB stretch w/ strap 30 sec x 2  Hip abduction isometric green TB 3 sec x 10 alternating R/L  Sitting  Hamstring stretch 30 sec x 2 R/L Manual Therapy: Reviewed in transverse friction massage proximal lateral pole of patella L knee 1 min followed by ice massage 1-2 times/day  Modalities: Ice at home  Self Care: Avoid standing with knees hyperextended  Use of topical analgesic for pain management    PATIENT EDUCATION:  Education details: POC; HEP  Person educated: Patient Education method: Explanation, Demonstration, Tactile cues, Verbal cues, and Handouts Education comprehension: verbalized understanding, returned demonstration, verbal cues required, tactile cues required, and needs further education  HOME EXERCISE PROGRAM: Access Code: [email or web address] URL: [email or web address] Date: 11/05/2022 Prepared by: Corlis Leak  Exercises - Prone Quadriceps Stretch with Strap  - 2 x daily - 7 x weekly - 1 sets - 3 reps - 30 sec  hold - Prone Quadriceps Set  - 2 x daily - 7 x weekly - 1-2 sets - 10 reps - 5-10 sec  hold - Hooklying Hamstring Stretch with Strap  - 2 x daily - 7 x weekly - 1 sets - 3 reps - 30 sec  hold - Supine ITB Stretch with Strap  - 2 x daily - 7 x weekly - 1 sets - 3 reps - 30 sec  hold - Supine Quad Set  - 2 x daily - 7 x weekly - 1 sets - 10 reps - 3 sec  hold - Straight Leg Raise with External Rotation  - 2 x daily - 7 x weekly - 1 sets - 10 reps - 3-5 sec  hold  Patient Education - Trigger Point Dry Needling  ASSESSMENT:  CLINICAL IMPRESSION: Patient reports increased soreness in lateral L knee but mostly in the calves. She has decreased sharp pain. Gait continues to improve with increased L knee extension and improved weight shift. Reviewed exercises for stretching and  strengthening. No new exercises today. Discussed DOMS and that she should drink more water and continue with gentle exercises, decreasing intensity or reps as needed, gradually increasing reps as tolerated. Good response to manual work and massage stick.  OBJECTIVE IMPAIRMENTS: chronic L knee pain. She has a history of bilat plantar fasciitis which resolved after 5 years but then she noticed L knee pain starting 3/24. Pain has persisted, some days more noticeable than others. Patient present with poor posture and alignment; antalgic gait; decreased ROM/mobility L > R LE; LE weakness; pain limiting functional activity level; abnormal gait, decreased activity tolerance, decreased balance, decreased endurance, decreased mobility, decreased ROM, decreased strength, increased fascial restrictions, impaired flexibility, improper body mechanics, postural dysfunction, obesity, and pain.      GOALS: Goals reviewed with patient? Yes  SHORT TERM GOALS: Target date: 12/17/2022  Independent in HEP  Baseline: Goal status: INITIAL  2.  L knee ROM 0 deg extension to 110 deg flexion  Baseline:  Goal status: INITIAL   LONG TERM GOALS: Target date: 01/28/2023   Decrease L knee pain 75=100% allowing patient to return to normal functional activities  Baseline:  Goal status: INITIAL  2.  4+/5 to 5/5 strength bilat LE's  Baseline:  Goal status: INITIAL  3.  Ambulation with improved gait pattern without antalgic gait pattern  Baseline:  Goal status: INITIAL  4.  Patient reports return to walking program for fitness walking for  15-20 min with no increase in pain L knee > 1-2/10  Baseline:  Goal status: INITIAL  5.  Independent in HEP including aquatic therapy program as indicated  Baseline:  Goal status: INITIAL  6.  Improve functional limitation score to 65 Baseline: 47 Goal status: INITIAL   PLAN:  PT FREQUENCY: 2x/week  PT DURATION: 12 weeks  PLANNED INTERVENTIONS: Therapeutic  exercises, Therapeutic activity, Neuromuscular re-education, Balance training, Gait training, Patient/Family education, Self Care, Joint mobilization, Stair training, Aquatic Therapy, Dry Needling, Electrical stimulation, Cryotherapy, Moist heat, Taping, Ultrasound, Ionotophoresis 4mg /ml Dexamethasone, Manual therapy, and Re-evaluation  PLAN FOR NEXT SESSION: review and progress exercise; manual work, DN, modalities as indicated  Add gastroc/soleus stretches; progress with strengthening for medial quad, stretch lateral quad/ITB, manual work lateral, distal quad to release muscular tightness at patella; consider taping and/or DN for lateral quad tightness    Joellyn Grandt P Verle Brillhart, PT 11/11/2022, 3:42 PM

## 2022-11-18 ENCOUNTER — Encounter: Payer: Self-pay | Admitting: Rehabilitative and Restorative Service Providers"

## 2022-11-18 ENCOUNTER — Ambulatory Visit: Payer: BC Managed Care – PPO | Admitting: Rehabilitative and Restorative Service Providers"

## 2022-11-18 DIAGNOSIS — M25562 Pain in left knee: Secondary | ICD-10-CM | POA: Diagnosis not present

## 2022-11-18 DIAGNOSIS — R2689 Other abnormalities of gait and mobility: Secondary | ICD-10-CM

## 2022-11-18 DIAGNOSIS — R29898 Other symptoms and signs involving the musculoskeletal system: Secondary | ICD-10-CM

## 2022-11-18 DIAGNOSIS — G8929 Other chronic pain: Secondary | ICD-10-CM

## 2022-11-18 DIAGNOSIS — M6281 Muscle weakness (generalized): Secondary | ICD-10-CM

## 2022-11-18 NOTE — Therapy (Signed)
OUTPATIENT PHYSICAL THERAPY LOWER EXTREMITY TREATMENT    Patient Name: [name] MRN: [id number] DOB:11/10/1975, 47 y.o., female Today's Date: 11/18/2022  END OF SESSION:  PT End of Session - 11/18/22 0846     Visit Number 4    Number of Visits 24    Date for PT Re-Evaluation 01/28/23    Authorization Type state health plan 70/30 copay    PT Start Time 0844    PT Stop Time 0930    PT Time Calculation (min) 46 min             Past Medical History:  Diagnosis Date   Hypertension    IBS (irritable bowel syndrome)    PCOS (polycystic ovarian syndrome)    Past Surgical History:  Procedure Laterality Date   CESAREAN SECTION  2008   CESAREAN SECTION  02/02/2011   Procedure: CESAREAN SECTION;  Surgeon: Jeani Hawking, MD;  Location: [facility];  Service: Gynecology;  Laterality: N/A;   ESSURE TUBAL LIGATION  03/26/11   Patient Active Problem List   Diagnosis Date Noted   Chronic pain of left knee 10/22/2022   Abnormal CT of the abdomen 12/08/2021   Snoring 12/22/2019   IFG (impaired fasting glucose) 10/01/2017   Family history of pancreatic cancer 12/05/2015   Essential hypertension 06/24/2015   S/P cesarean section 02/02/2011   POLYCYSTIC OVARIAN DISEASE 03/26/2008   Hyperlipidemia 03/26/2008   COMMON MIGRAINE 03/26/2008   IBS 03/26/2008    PCP: Dr Nani Gasser  REFERRING PROVIDER: Dr Rodney Langton   REFERRING DIAG: Chronic Lt knee pain   THERAPY DIAG:  Chronic pain of left knee  Other symptoms and signs involving the musculoskeletal system  Muscle weakness (generalized)  Other abnormalities of gait and mobility  Rationale for Evaluation and Treatment: Rehabilitation  ONSET DATE: 07/24/22  SUBJECTIVE:   SUBJECTIVE STATEMENT: Patient reports no pain in the L knee and no soreness in the L knee and calf. She is not having the sharp pain just some soreness. She that she has been working on her exercises at home. She has massage stick for home  now and that works well. Does not want to go to pool for aquatic exercise instruction. Land based exercises are going well.   EVAL: Patient reports that she went to [facility] 2019 and developed bilat plantar fasciitis with resolution of plantar fasciitis but she has severe pain in the Lt knee since 3/24. Pain is primarily around the knee cap and on both sides of the knees. She feels soreness in the back of calf and knee at times.   PERTINENT HISTORY: HTN; headaches; bilat plantar fasciitis x 5 yrs now resolved  PAIN:  Are you having pain? Yes: NPRS scale: 5-6/10 Pain location: Lt knee  Pain description: dull  Aggravating factors: walking; steps; squatting; bending; lifting   Relieving factors: meds   PRECAUTIONS: Knee  WEIGHT BEARING RESTRICTIONS: No  FALLS:  Has patient fallen in last 6 months? Yes. Number of falls 1 - no injury  LIVING ENVIRONMENT: Lives with: lives with their spouse Lives in: House/apartment Stairs: Yes: External: 1 steps; none Has following equipment at home: None  OCCUPATION: Comptroller - some walking; desk work and up/down; Building control surveyor x 17 yrs   Household chores; child care   PATIENT GOALS: get rid of the pain and return to walking   NEXT MD VISIT: 12/02/22  OBJECTIVE:   DIAGNOSTIC FINDINGS: xray 10/22/22 - No evidence of fracture, dislocation, or joint effusion. No evidence  of arthropathy or other focal bone abnormality. Soft tissues are unremarkable.  PATIENT SURVEYS:  FOTO 47; goal 65  EDEMA:  Minor edema on an intermittent basis   Balance:   R 8 sec; L 6 sec   MUSCLE LENGTH: Hamstrings: Right 90 deg; Left 80 deg Thomas test: tight hip flexors  Quad prone flexion: R 90; L 80  POSTURE: rounded shoulders, forward head, flexed trunk , and L knee flexed/R knee hyperextended   PALPATION: Muscular tightness lateral distal quad to lateral proximal calf   LOWER EXTREMITY ROM:  Active ROM Right eval Left eval  Hip flexion Tight  Tight    Hip extension Tight  Tight   Hip abduction    Hip adduction    Hip internal rotation    Hip external rotation    Knee flexion 112 103  Knee extension 5 -9  Ankle dorsiflexion Tight  Tight   Ankle plantarflexion    Ankle inversion    Ankle eversion     (Blank rows = not tested)  LOWER EXTREMITY MMT:  MMT Right eval Left eval  Hip flexion 5 4  Hip extension 4 4-  Hip abduction 5 4  Hip adduction    Hip internal rotation    Hip external rotation    Knee flexion 5 4+  Knee extension 5 4 painful   Ankle dorsiflexion    Ankle plantarflexion 4 3 painful  Ankle inversion    Ankle eversion     (Blank rows = not tested)  LOWER EXTREMITY SPECIAL TESTS:  Hip special tests: Piriformis test: positive tight L  FUNCTIONAL TESTS:  5 times sit to stand: 15.89 sec with pain in L knee   GAIT: Distance walked: 40 Assistive device utilized: None Level of assistance: Complete Independence Comments: antalgic gait L in Wt bearing L    [facility] Adult PT Treatment:                                                DATE: 11/18/22 Therapeutic Exercise: Nustep L5 x 5 min (seat 4) Standing  Gastroc stretch 30 sec x 2 R/L Soleus stretch 30 sec x 2 R/L Wall squat 5 sec x 10  Single leg stance 30 sec x 2 R/L TKE ball behind knee 3 sec x 10  Prone  Quad stretch 30 sec x 2 Glut set 5 sec x 10 Supine  HS stretch 30 sec x 2  ITB stretch w/ strap 30 sec x 2  Alternate windshield wiper in hook lying x 15-20 Hip abduction isometric green TB 3 sec x 10 alternating R/L  Sitting  Hamstring stretch 30 sec x 2 R/L Sit to stand x 10  Manual Therapy: IASTM L lateral thigh/quads(HEP)  Massage stick(home) Reviewed in transverse friction massage proximal lateral pole of patella L knee 1 min followed by ice massage 1-2 times/day(HEP)  Modalities: Ice at home  Self Care: Avoid standing with knees hyperextended  Use of topical analgesic for pain management   [facility] Adult PT Treatment:                                                 DATE: 11/11/22 Therapeutic Exercise: Standing  Gastroc stretch 30 sec x  2 R/L Soleus stretch 30 sec x 2 R/L Wall squat 5 sec x 10  Single leg stance 10 sec x 3 R/L  Step up 4" step x 10 L/R Prone  Quad stretch 30 sec x 3 Glut set 5 sec x 10 Supine  HS stretch 30 sec x 2  ITB stretch w/ strap 30 sec x 2  Alternate windshield wiper in hook lying x 15-20 Hip abduction isometric green TB 3 sec x 10 alternating R/L  Sitting  Hamstring stretch 30 sec x 2 R/L Manual Therapy: IASTM L lateral thigh/quads  Trial of Massage stick for home use Reviewed in transverse friction massage proximal lateral pole of patella L knee 1 min followed by ice massage 1-2 times/day  Modalities: Ice at home  Self Care: Avoid standing with knees hyperextended  Use of topical analgesic for pain management    PATIENT EDUCATION:  Education details: POC; HEP  Person educated: Patient Education method: Explanation, Demonstration, Tactile cues, Verbal cues, and Handouts Education comprehension: verbalized understanding, returned demonstration, verbal cues required, tactile cues required, and needs further education  HOME EXERCISE PROGRAM: Access Code: [email or web address] URL: [email or web address] Date: 11/18/2022 Prepared by: Corlis Leak  Exercises - Prone Quadriceps Stretch with Strap  - 2 x daily - 7 x weekly - 1 sets - 3 reps - 30 sec  hold - Prone Quadriceps Set  - 2 x daily - 7 x weekly - 1-2 sets - 10 reps - 5-10 sec  hold - Hooklying Hamstring Stretch with Strap  - 2 x daily - 7 x weekly - 1 sets - 3 reps - 30 sec  hold - Supine ITB Stretch with Strap  - 2 x daily - 7 x weekly - 1 sets - 3 reps - 30 sec  hold - Supine Quad Set  - 2 x daily - 7 x weekly - 1 sets - 10 reps - 3 sec  hold - Straight Leg Raise with External Rotation  - 2 x daily - 7 x weekly - 1 sets - 10 reps - 3-5 sec  hold - Seated Hamstring Stretch  - 2 x daily - 7 x weekly - 1 sets - 3 reps - 30 sec   hold - Gastroc Stretch on Wall  - 2 x daily - 7 x weekly - 1 sets - 3 reps - 30 sec  hold - Soleus Stretch on Wall  - 2 x daily - 7 x weekly - 1 sets - 3 reps - 30 sec  hold - Wall Quarter Squat  - 2 x daily - 7 x weekly - 1-2 sets - 10 reps - 5-10 sec  hold - Single Leg Stance with Support  - 2 x daily - 7 x weekly - 1 sets - 3 reps - 30 sec  hold - Prone Gluteal Sets  - 2 x daily - 7 x weekly - 1 sets - 10 reps - 5-10 sec  hold - Prone Hip Extension  - 2 x daily - 7 x weekly - 1 sets - 5-10 reps - 5 sec  hold - Hooklying Isometric Clamshell  - 2 x daily - 7 x weekly - 1 sets - 10 reps - 3 sec  hold - Standing Terminal Knee Extension at Wall with Ball  - 2 x daily - 7 x weekly - 1-2 sets - 10 reps - 5-10 sec  hold - Standing Heel Raise with Support  - 1 x daily - 7 x weekly -  1 sets - 10 reps - 1 sec  hold - Sit to Stand  - 2 x daily - 7 x weekly - 1 sets - 10 reps - 3-5 sec  hold - Step Up  - 2 x daily - 7 x weekly - 1 sets - 10 reps - 2 sec  hold Patient Education - Trigger Point Dry Needling  ASSESSMENT:  CLINICAL IMPRESSION: Patient reports good improvement with L knee pain. She is working on exercises and is pleased with progress. She has no sharp pain. Gait continues to improve with increased L knee extension and improved weight shift. Reviewed and progressed exercises for stretching and strengthening.   OBJECTIVE IMPAIRMENTS: chronic L knee pain. She has a history of bilat plantar fasciitis which resolved after 5 years but then she noticed L knee pain starting 3/24. Pain has persisted, some days more noticeable than others. Patient present with poor posture and alignment; antalgic gait; decreased ROM/mobility L > R LE; LE weakness; pain limiting functional activity level; abnormal gait, decreased activity tolerance, decreased balance, decreased endurance, decreased mobility, decreased ROM, decreased strength, increased fascial restrictions, impaired flexibility, improper body mechanics,  postural dysfunction, obesity, and pain.      GOALS: Goals reviewed with patient? Yes  SHORT TERM GOALS: Target date: 12/17/2022  Independent in HEP  Baseline: Goal status: INITIAL  2.  L knee ROM 0 deg extension to 110 deg flexion  Baseline:  Goal status: INITIAL   LONG TERM GOALS: Target date: 01/28/2023   Decrease L knee pain 75=100% allowing patient to return to normal functional activities  Baseline:  Goal status: INITIAL  2.  4+/5 to 5/5 strength bilat LE's  Baseline:  Goal status: INITIAL  3.  Ambulation with improved gait pattern without antalgic gait pattern  Baseline:  Goal status: INITIAL  4.  Patient reports return to walking program for fitness walking for 15-20 min with no increase in pain L knee > 1-2/10  Baseline:  Goal status: INITIAL  5.  Independent in HEP including aquatic therapy program as indicated  Baseline:  Goal status: INITIAL  6.  Improve functional limitation score to 65 Baseline: 47 Goal status: INITIAL   PLAN:  PT FREQUENCY: 2x/week  PT DURATION: 12 weeks  PLANNED INTERVENTIONS: Therapeutic exercises, Therapeutic activity, Neuromuscular re-education, Balance training, Gait training, Patient/Family education, Self Care, Joint mobilization, Stair training, Aquatic Therapy, Dry Needling, Electrical stimulation, Cryotherapy, Moist heat, Taping, Ultrasound, Ionotophoresis 4mg /ml Dexamethasone, Manual therapy, and Re-evaluation  PLAN FOR NEXT SESSION: review and progress exercise; manual work, DN, modalities as indicated  Add gastroc/soleus stretches; progress with strengthening for medial quad, stretch lateral quad/ITB, manual work lateral, distal quad to release muscular tightness at patella; consider taping and/or DN for lateral quad tightness    Mildred Tuccillo P Kainen Struckman, PT 11/18/2022, 8:51 AM

## 2022-11-24 ENCOUNTER — Encounter: Payer: Self-pay | Admitting: Rehabilitative and Restorative Service Providers"

## 2022-11-24 ENCOUNTER — Ambulatory Visit: Payer: BC Managed Care – PPO | Attending: Sports Medicine | Admitting: Rehabilitative and Restorative Service Providers"

## 2022-11-24 DIAGNOSIS — M25562 Pain in left knee: Secondary | ICD-10-CM | POA: Diagnosis present

## 2022-11-24 DIAGNOSIS — M6281 Muscle weakness (generalized): Secondary | ICD-10-CM | POA: Insufficient documentation

## 2022-11-24 DIAGNOSIS — R29898 Other symptoms and signs involving the musculoskeletal system: Secondary | ICD-10-CM | POA: Insufficient documentation

## 2022-11-24 DIAGNOSIS — G8929 Other chronic pain: Secondary | ICD-10-CM | POA: Insufficient documentation

## 2022-11-24 DIAGNOSIS — R2689 Other abnormalities of gait and mobility: Secondary | ICD-10-CM | POA: Diagnosis present

## 2022-11-24 NOTE — Therapy (Addendum)
OUTPATIENT PHYSICAL THERAPY LOWER EXTREMITY TREATMENT    Patient Name: [name] MRN: [id number] DOB:15-Jan-1976, 47 y.o., female Today's Date: 11/24/2022  END OF SESSION:  PT End of Session - 11/24/22 1641     Visit Number 5    Number of Visits 24    Date for PT Re-Evaluation 01/28/23    Authorization Type state health plan 70/30 copay    PT Start Time 1616    PT Stop Time 1704    PT Time Calculation (min) 48 min    Activity Tolerance Patient tolerated treatment well             Past Medical History:  Diagnosis Date   Hypertension    IBS (irritable bowel syndrome)    PCOS (polycystic ovarian syndrome)    Past Surgical History:  Procedure Laterality Date   CESAREAN SECTION  2008   CESAREAN SECTION  02/02/2011   Procedure: CESAREAN SECTION;  Surgeon: Jeani Hawking, MD;  Location: [facility];  Service: Gynecology;  Laterality: N/A;   ESSURE TUBAL LIGATION  03/26/11   Patient Active Problem List   Diagnosis Date Noted   Chronic pain of left knee 10/22/2022   Abnormal CT of the abdomen 12/08/2021   Snoring 12/22/2019   IFG (impaired fasting glucose) 10/01/2017   Family history of pancreatic cancer 12/05/2015   Essential hypertension 06/24/2015   S/P cesarean section 02/02/2011   POLYCYSTIC OVARIAN DISEASE 03/26/2008   Hyperlipidemia 03/26/2008   COMMON MIGRAINE 03/26/2008   IBS 03/26/2008    PCP: Dr Nani Gasser  REFERRING PROVIDER: Dr Rodney Langton   REFERRING DIAG: Chronic Lt knee pain   THERAPY DIAG:  Chronic pain of left knee  Other symptoms and signs involving the musculoskeletal system  Muscle weakness (generalized)  Other abnormalities of gait and mobility  Rationale for Evaluation and Treatment: Rehabilitation  ONSET DATE: 07/24/22  SUBJECTIVE:   SUBJECTIVE STATEMENT: Continues to improve. Patient reports no pain in the L knee and no soreness in the L knee and calf. She is not having the sharp pain just some soreness. She that  she has been working on her exercises at home. She has massage stick for home now and that works well. Does not want to go to pool for aquatic exercise instruction. Land based exercises are going well. RTD 12/02/22 Wednesday   EVAL: Patient reports that she went to [facility] 2019 and developed bilat plantar fasciitis with resolution of plantar fasciitis but she has severe pain in the Lt knee since 3/24. Pain is primarily around the knee cap and on both sides of the knees. She feels soreness in the back of calf and knee at times.   PERTINENT HISTORY: HTN; headaches; bilat plantar fasciitis x 5 yrs now resolved  PAIN:  Are you having pain? Yes: NPRS scale: 0/10 Pain location: Lt knee  Pain description: dull  Aggravating factors: walking; steps; squatting; bending; lifting   Relieving factors: meds   PRECAUTIONS: Knee  WEIGHT BEARING RESTRICTIONS: No  FALLS:  Has patient fallen in last 6 months? Yes. Number of falls 1 - no injury  LIVING ENVIRONMENT: Lives with: lives with their spouse Lives in: House/apartment Stairs: Yes: External: 1 steps; none Has following equipment at home: None  OCCUPATION: Comptroller - some walking; desk work and up/down; Building control surveyor x 17 yrs   Household chores; child care   PATIENT GOALS: get rid of the pain and return to walking   NEXT MD VISIT: 12/02/22  OBJECTIVE:  DIAGNOSTIC FINDINGS: xray 10/22/22 - No evidence of fracture, dislocation, or joint effusion. No evidence of arthropathy or other focal bone abnormality. Soft tissues are unremarkable.  PATIENT SURVEYS:  FOTO 47; goal 65  EDEMA:  Minor edema on an intermittent basis   Balance:   R 8 sec; L 6 sec   MUSCLE LENGTH: Hamstrings: Right 90 deg; Left 80 deg Thomas test: tight hip flexors  Quad prone flexion: R 90; L 80  POSTURE: rounded shoulders, forward head, flexed trunk , and L knee flexed/R knee hyperextended   PALPATION: Muscular tightness lateral distal quad to lateral  proximal calf   LOWER EXTREMITY ROM:  Active ROM Right eval Left eval  Hip flexion Tight  Tight   Hip extension Tight  Tight   Hip abduction    Hip adduction    Hip internal rotation    Hip external rotation    Knee flexion 112 103  Knee extension 5 -9  Ankle dorsiflexion Tight  Tight   Ankle plantarflexion    Ankle inversion    Ankle eversion     (Blank rows = not tested)  LOWER EXTREMITY MMT:  MMT Right eval Left eval  Hip flexion 5 4  Hip extension 4 4-  Hip abduction 5 4  Hip adduction    Hip internal rotation    Hip external rotation    Knee flexion 5 4+  Knee extension 5 4 painful   Ankle dorsiflexion    Ankle plantarflexion 4 3 painful  Ankle inversion    Ankle eversion     (Blank rows = not tested)  LOWER EXTREMITY SPECIAL TESTS:  Hip special tests: Piriformis test: positive tight L  FUNCTIONAL TESTS:  5 times sit to stand: 15.89 sec with pain in L knee   GAIT: Distance walked: 40 Assistive device utilized: None Level of assistance: Complete Independence Comments: antalgic gait L in Wt bearing L    [facility] Adult PT Treatment:                                                DATE: 11/24/22 Therapeutic Exercise: Nustep L5 x 5 min (seat 4) Standing  Gastroc stretch 30 sec x 2 R/L Soleus stretch 30 sec x 2 R/L Wall squat 5 sec x 10  Single leg stance 30 sec x 2 R/L TKE ball behind knee 3 sec x 10  TKE blue TB x 3 sec x 10 3 way slide on slider x 5 R/L Diver frw to touch chair seat x 10 R/L   Side steps red TB above knees x 10 feet x 5  Prone  Quad stretch 30 sec x 2 Glut set 5 sec x 10 Supine  HS stretch 30 sec x 2  ITB stretch w/ strap 30 sec x 2  Alternate windshield wiper in hook lying x 15-20 Hip abduction isometric green TB 3 sec x 10 alternating R/L  Sitting  Hamstring stretch 30 sec x 2 R/L Sit to stand x 10  Manual Therapy: IASTM L lateral thigh/quads(HEP)  Massage stick(home) Reviewed in transverse friction massage proximal lateral  pole of patella L knee 1 min followed by ice massage 1-2 times/day(HEP)  Modalities: Ice at home  Self Care: Avoid standing with knees hyperextended  Use of topical analgesic for pain management   [facility] Adult PT Treatment:  DATE: 11/18/22 Therapeutic Exercise: Nustep L5 x 5 min (seat 4) Standing  Gastroc stretch 30 sec x 2 R/L Soleus stretch 30 sec x 2 R/L Wall squat 5 sec x 10  Single leg stance 30 sec x 2 R/L TKE ball behind knee 3 sec x 10  Prone  Quad stretch 30 sec x 2 Glut set 5 sec x 10 Supine  HS stretch 30 sec x 2  ITB stretch w/ strap 30 sec x 2  Alternate windshield wiper in hook lying x 15-20 Hip abduction isometric green TB 3 sec x 10 alternating R/L  Sitting  Hamstring stretch 30 sec x 2 R/L Sit to stand x 10  Manual Therapy: IASTM L lateral thigh/quads(HEP)  Massage stick(home) Reviewed in transverse friction massage proximal lateral pole of patella L knee 1 min followed by ice massage 1-2 times/day(HEP)  Modalities: Ice at home  Self Care: Avoid standing with knees hyperextended  Use of topical analgesic for pain management    PATIENT EDUCATION:  Education details: POC; HEP  Person educated: Patient Education method: Explanation, Demonstration, Tactile cues, Verbal cues, and Handouts Education comprehension: verbalized understanding, returned demonstration, verbal cues required, tactile cues required, and needs further education  HOME EXERCISE PROGRAM: Access Code: [email or web address] URL: [email or web address] Date: 11/24/2022 Prepared by: Corlis Leak  Exercises - Prone Quadriceps Stretch with Strap  - 2 x daily - 7 x weekly - 1 sets - 3 reps - 30 sec  hold - Prone Quadriceps Set  - 2 x daily - 7 x weekly - 1-2 sets - 10 reps - 5-10 sec  hold - Hooklying Hamstring Stretch with Strap  - 2 x daily - 7 x weekly - 1 sets - 3 reps - 30 sec  hold - Supine ITB Stretch with Strap  - 2 x daily - 7 x weekly - 1  sets - 3 reps - 30 sec  hold - Supine Quad Set  - 2 x daily - 7 x weekly - 1 sets - 10 reps - 3 sec  hold - Straight Leg Raise with External Rotation  - 2 x daily - 7 x weekly - 1 sets - 10 reps - 3-5 sec  hold - Seated Hamstring Stretch  - 2 x daily - 7 x weekly - 1 sets - 3 reps - 30 sec  hold - Gastroc Stretch on Wall  - 2 x daily - 7 x weekly - 1 sets - 3 reps - 30 sec  hold - Soleus Stretch on Wall  - 2 x daily - 7 x weekly - 1 sets - 3 reps - 30 sec  hold - Wall Quarter Squat  - 2 x daily - 7 x weekly - 1-2 sets - 10 reps - 5-10 sec  hold - Single Leg Stance with Support  - 2 x daily - 7 x weekly - 1 sets - 3 reps - 30 sec  hold - Prone Gluteal Sets  - 2 x daily - 7 x weekly - 1 sets - 10 reps - 5-10 sec  hold - Prone Hip Extension  - 2 x daily - 7 x weekly - 1 sets - 5-10 reps - 5 sec  hold - Hooklying Isometric Clamshell  - 2 x daily - 7 x weekly - 1 sets - 10 reps - 3 sec  hold - Standing Terminal Knee Extension at Wall with Ball  - 2 x daily - 7 x weekly - 1-2 sets - 10 reps -  5-10 sec  hold - Standing Heel Raise with Support  - 1 x daily - 7 x weekly - 1 sets - 10 reps - 1 sec  hold - Sit to Stand  - 2 x daily - 7 x weekly - 1 sets - 10 reps - 3-5 sec  hold - Step Up  - 2 x daily - 7 x weekly - 1 sets - 10 reps - 2 sec  hold - Lateral Step Up  - 2 x daily - 7 x weekly - 2 sets - 10 reps - 2 sec  hold - The Diver  - 2 x daily - 7 x weekly - 1 sets - 10 reps - 2-3 sec  hold - Single Leg Balance with Clock Reach  - 1 x daily - 7 x weekly - 1-2 sets - 10 reps - Standing Terminal Knee Extension with Resistance  - 2 x daily - 7 x weekly - 2-3 sets - 10 reps - 3-5 sec  hold - Side Stepping with Resistance at Thighs  - 1 x daily - 7 x weekly  ASSESSMENT:  CLINICAL IMPRESSION: Patient reports good improvement with L knee pain. She is working on exercises and is pleased with progress. She has no sharp pain. Gait continues to improve with increased L knee extension and improved weight shift.  Reviewed and progressed exercises for stretching and strengthening.   OBJECTIVE IMPAIRMENTS: chronic L knee pain. She has a history of bilat plantar fasciitis which resolved after 5 years but then she noticed L knee pain starting 3/24. Pain has persisted, some days more noticeable than others. Patient present with poor posture and alignment; antalgic gait; decreased ROM/mobility L > R LE; LE weakness; pain limiting functional activity level; abnormal gait, decreased activity tolerance, decreased balance, decreased endurance, decreased mobility, decreased ROM, decreased strength, increased fascial restrictions, impaired flexibility, improper body mechanics, postural dysfunction, obesity, and pain.      GOALS: Goals reviewed with patient? Yes  SHORT TERM GOALS: Target date: 12/17/2022  Independent in HEP  Baseline: Goal status: INITIAL  2.  L knee ROM 0 deg extension to 110 deg flexion  Baseline:  Goal status: INITIAL   LONG TERM GOALS: Target date: 01/28/2023   Decrease L knee pain 75=100% allowing patient to return to normal functional activities  Baseline:  Goal status: met  2.  4+/5 to 5/5 strength bilat LE's  Baseline:  Goal status: INITIAL  3.  Ambulation with improved gait pattern without antalgic gait pattern  Baseline:  Goal status: INITIAL  4.  Patient reports return to walking program for fitness walking for 15-20 min with no increase in pain L knee > 1-2/10  Baseline:  Goal status: INITIAL  5.  Independent in HEP including aquatic therapy program as indicated  Baseline:  Goal status: INITIAL  6.  Improve functional limitation score to 65 Baseline: 47 Goal status: INITIAL   PLAN:  PT FREQUENCY: 2x/week  PT DURATION: 12 weeks  PLANNED INTERVENTIONS: Therapeutic exercises, Therapeutic activity, Neuromuscular re-education, Balance training, Gait training, Patient/Family education, Self Care, Joint mobilization, Stair training, Aquatic Therapy, Dry Needling,  Electrical stimulation, Cryotherapy, Moist heat, Taping, Ultrasound, Ionotophoresis 4mg /ml Dexamethasone, Manual therapy, and Re-evaluation  PLAN FOR NEXT SESSION: review and progress exercise; manual work, DN, modalities as indicated Progress with strengthening for medial quad, stretch lateral quad/ITB, manual work lateral, distal quad to release muscular tightness at patella; consider taping and/or DN for lateral quad tightness    Gibbs Naugle Rober Minion, PT  11/24/2022, 5:17 PM

## 2022-11-25 ENCOUNTER — Encounter: Payer: Self-pay | Admitting: Rehabilitative and Restorative Service Providers"

## 2022-11-25 ENCOUNTER — Ambulatory Visit: Payer: BC Managed Care – PPO | Admitting: Rehabilitative and Restorative Service Providers"

## 2022-11-25 DIAGNOSIS — R29898 Other symptoms and signs involving the musculoskeletal system: Secondary | ICD-10-CM

## 2022-11-25 DIAGNOSIS — M6281 Muscle weakness (generalized): Secondary | ICD-10-CM

## 2022-11-25 DIAGNOSIS — M25562 Pain in left knee: Secondary | ICD-10-CM | POA: Diagnosis not present

## 2022-11-25 DIAGNOSIS — G8929 Other chronic pain: Secondary | ICD-10-CM

## 2022-11-25 DIAGNOSIS — R2689 Other abnormalities of gait and mobility: Secondary | ICD-10-CM

## 2022-11-25 NOTE — Therapy (Signed)
OUTPATIENT PHYSICAL THERAPY LOWER EXTREMITY TREATMENT    Patient Name: [name] MRN: [id number] DOB:February 12, 1976, 47 y.o., female Today's Date: 11/25/2022  END OF SESSION:  PT End of Session - 11/25/22 1530     Visit Number 6    Number of Visits 24    Date for PT Re-Evaluation 01/28/23    Authorization Type state health plan 70/30 copay    PT Start Time 1529    PT Stop Time 1611    PT Time Calculation (min) 42 min    Activity Tolerance Patient tolerated treatment well             Past Medical History:  Diagnosis Date   Hypertension    IBS (irritable bowel syndrome)    PCOS (polycystic ovarian syndrome)    Past Surgical History:  Procedure Laterality Date   CESAREAN SECTION  2008   CESAREAN SECTION  02/02/2011   Procedure: CESAREAN SECTION;  Surgeon: Jeani Hawking, MD;  Location: [facility];  Service: Gynecology;  Laterality: N/A;   ESSURE TUBAL LIGATION  03/26/11   Patient Active Problem List   Diagnosis Date Noted   Chronic pain of left knee 10/22/2022   Abnormal CT of the abdomen 12/08/2021   Snoring 12/22/2019   IFG (impaired fasting glucose) 10/01/2017   Family history of pancreatic cancer 12/05/2015   Essential hypertension 06/24/2015   S/P cesarean section 02/02/2011   POLYCYSTIC OVARIAN DISEASE 03/26/2008   Hyperlipidemia 03/26/2008   COMMON MIGRAINE 03/26/2008   IBS 03/26/2008    PCP: Dr Nani Gasser  REFERRING PROVIDER: Dr Rodney Langton   REFERRING DIAG: Chronic Lt knee pain   THERAPY DIAG:  Chronic pain of left knee  Other symptoms and signs involving the musculoskeletal system  Muscle weakness (generalized)  Other abnormalities of gait and mobility  Rationale for Evaluation and Treatment: Rehabilitation  ONSET DATE: 07/24/22  SUBJECTIVE:   SUBJECTIVE STATEMENT: Some soreness from yesterday's exercises. Pleased with progress. She has notices that she has less pain when she stands from having been in sitting for a period of  time; less pain in the morning upon awakening. She can tell her knee is She is not having the sharp pain just some soreness. She that she has been working on her exercises at home. She has massage stick for home now and that works well. Does not want to go to pool for aquatic exercise instruction. Land based exercises are going well. RTD 12/02/22 Wednesday   EVAL: Patient reports that she went to [facility] 2019 and developed bilat plantar fasciitis with resolution of plantar fasciitis but she has severe pain in the Lt knee since 3/24. Pain is primarily around the knee cap and on both sides of the knees. She feels soreness in the back of calf and knee at times.   PERTINENT HISTORY: HTN; headaches; bilat plantar fasciitis x 5 yrs now resolved  PAIN:  Are you having pain? Yes: NPRS scale: 0/10; worst 1-2/10(sore) Pain location: Lt knee  Pain description: dull  Aggravating factors: walking; steps; squatting; bending; lifting   Relieving factors: meds   PRECAUTIONS: Knee  WEIGHT BEARING RESTRICTIONS: No  FALLS:  Has patient fallen in last 6 months? Yes. Number of falls 1 - no injury  LIVING ENVIRONMENT: Lives with: lives with their spouse Lives in: House/apartment Stairs: Yes: External: 1 steps; none Has following equipment at home: None  OCCUPATION: Comptroller - some walking; desk work and up/down; Building control surveyor x 17 yrs   Household chores; child  care   PATIENT GOALS: get rid of the pain and return to walking   NEXT MD VISIT: 12/02/22  OBJECTIVE:   DIAGNOSTIC FINDINGS: xray 10/22/22 - No evidence of fracture, dislocation, or joint effusion. No evidence of arthropathy or other focal bone abnormality. Soft tissues are unremarkable.  PATIENT SURVEYS:  FOTO 47; goal 65  EDEMA:  Minor edema on an intermittent basis   Balance:   R 8 sec; L 6 sec   MUSCLE LENGTH: Hamstrings: Right 90 deg; Left 80 deg Thomas test: tight hip flexors  Quad prone flexion: R 90; L 80  POSTURE:  rounded shoulders, forward head, flexed trunk , and L knee flexed/R knee hyperextended   PALPATION: Muscular tightness lateral distal quad to lateral proximal calf   LOWER EXTREMITY ROM:  Active ROM Right eval Left eval  Hip flexion Tight  Tight   Hip extension Tight  Tight   Hip abduction    Hip adduction    Hip internal rotation    Hip external rotation    Knee flexion 112 103  Knee extension 5 -9  Ankle dorsiflexion Tight  Tight   Ankle plantarflexion    Ankle inversion    Ankle eversion     (Blank rows = not tested)  LOWER EXTREMITY MMT:  MMT Right eval Left eval  Hip flexion 5 4  Hip extension 4 4-  Hip abduction 5 4  Hip adduction    Hip internal rotation    Hip external rotation    Knee flexion 5 4+  Knee extension 5 4 painful   Ankle dorsiflexion    Ankle plantarflexion 4 3 painful  Ankle inversion    Ankle eversion     (Blank rows = not tested)  LOWER EXTREMITY SPECIAL TESTS:  Hip special tests: Piriformis test: positive tight L  FUNCTIONAL TESTS:  5 times sit to stand: 15.89 sec with pain in L knee   GAIT: Distance walked: 40 Assistive device utilized: None Level of assistance: Complete Independence Comments: antalgic gait L in Wt bearing L    [facility] Adult PT Treatment:                                                DATE: 11/25/22 Therapeutic Exercise: Nustep L5 x 5 min (seat 4) Standing  Gastroc stretch 30 sec x 2 R/L Soleus stretch 30 sec x 2 R/L Wall squat 5 sec x 10  Single leg stance 20 sec x 2 R/L TKE blue TB x 3 sec x 6 Step up 4" L/R x 10 Lateral step 4" L/R x 10  3 way touch on slider x 5 R/L Diver frw to touch chair seat x 6 R/ 10 L   Side steps green TB above knees x 10 feet x 5  Prone  Quad stretch 30 sec x 2 Glut set 5 sec x 10 Supine  HS stretch 30 sec x 2  ITB stretch w/ strap 30 sec x 2  Alternate windshield wiper in hook lying x 15-20 Hip abduction isometric green TB 3 sec x 10 alternating R/L  Sitting  Hamstring  stretch 30 sec x 2 R/L Sit to stand x 10  Modalities: Ice at home  Self Care: Avoid standing with knees hyperextended  Use of topical analgesic for pain management   [facility] Adult PT Treatment:  DATE: 11/24/22 Therapeutic Exercise: Nustep L5 x 5 min (seat 4) Standing  Gastroc stretch 30 sec x 2 R/L Soleus stretch 30 sec x 2 R/L Wall squat 5 sec x 10  Single leg stance 30 sec x 2 R/L TKE ball behind knee 3 sec x 10  TKE blue TB x 3 sec x 10 3 way slide on slider x 5 R/L Diver frw to touch chair seat x 10 R/L   Side steps red TB above knees x 10 feet x 5  Prone  Quad stretch 30 sec x 2 Glut set 5 sec x 10 Supine  HS stretch 30 sec x 2  ITB stretch w/ strap 30 sec x 2  Alternate windshield wiper in hook lying x 15-20 Hip abduction isometric green TB 3 sec x 10 alternating R/L  Sitting  Hamstring stretch 30 sec x 2 R/L Sit to stand x 10  Manual Therapy: IASTM L lateral thigh/quads(HEP)  Massage stick(home) Reviewed in transverse friction massage proximal lateral pole of patella L knee 1 min followed by ice massage 1-2 times/day(HEP)  Modalities: Ice at home  Self Care: Avoid standing with knees hyperextended  Use of topical analgesic for pain management   [facility] Adult PT Treatment:                                                DATE: 11/18/22 Therapeutic Exercise: Nustep L5 x 5 min (seat 4) Standing  Gastroc stretch 30 sec x 2 R/L Soleus stretch 30 sec x 2 R/L Wall squat 5 sec x 10  Single leg stance 30 sec x 2 R/L TKE ball behind knee 3 sec x 10  Prone  Quad stretch 30 sec x 2 Glut set 5 sec x 10 Supine  HS stretch 30 sec x 2  ITB stretch w/ strap 30 sec x 2  Alternate windshield wiper in hook lying x 15-20 Hip abduction isometric green TB 3 sec x 10 alternating R/L  Sitting  Hamstring stretch 30 sec x 2 R/L Sit to stand x 10  Manual Therapy: IASTM L lateral thigh/quads(HEP)  Massage stick(home) Reviewed in transverse  friction massage proximal lateral pole of patella L knee 1 min followed by ice massage 1-2 times/day(HEP)  Modalities: Ice at home  Self Care: Avoid standing with knees hyperextended  Use of topical analgesic for pain management    PATIENT EDUCATION:  Education details: POC; HEP  Person educated: Patient Education method: Explanation, Demonstration, Tactile cues, Verbal cues, and Handouts Education comprehension: verbalized understanding, returned demonstration, verbal cues required, tactile cues required, and needs further education  HOME EXERCISE PROGRAM: Access Code: [email or web address] URL: [email or web address] Date: 11/24/2022 Prepared by: Corlis Leak  Exercises - Prone Quadriceps Stretch with Strap  - 2 x daily - 7 x weekly - 1 sets - 3 reps - 30 sec  hold - Prone Quadriceps Set  - 2 x daily - 7 x weekly - 1-2 sets - 10 reps - 5-10 sec  hold - Hooklying Hamstring Stretch with Strap  - 2 x daily - 7 x weekly - 1 sets - 3 reps - 30 sec  hold - Supine ITB Stretch with Strap  - 2 x daily - 7 x weekly - 1 sets - 3 reps - 30 sec  hold - Supine Quad Set  - 2 x daily - 7 x  weekly - 1 sets - 10 reps - 3 sec  hold - Straight Leg Raise with External Rotation  - 2 x daily - 7 x weekly - 1 sets - 10 reps - 3-5 sec  hold - Seated Hamstring Stretch  - 2 x daily - 7 x weekly - 1 sets - 3 reps - 30 sec  hold - Gastroc Stretch on Wall  - 2 x daily - 7 x weekly - 1 sets - 3 reps - 30 sec  hold - Soleus Stretch on Wall  - 2 x daily - 7 x weekly - 1 sets - 3 reps - 30 sec  hold - Wall Quarter Squat  - 2 x daily - 7 x weekly - 1-2 sets - 10 reps - 5-10 sec  hold - Single Leg Stance with Support  - 2 x daily - 7 x weekly - 1 sets - 3 reps - 30 sec  hold - Prone Gluteal Sets  - 2 x daily - 7 x weekly - 1 sets - 10 reps - 5-10 sec  hold - Prone Hip Extension  - 2 x daily - 7 x weekly - 1 sets - 5-10 reps - 5 sec  hold - Hooklying Isometric Clamshell  - 2 x daily - 7 x weekly - 1 sets - 10 reps - 3  sec  hold - Standing Terminal Knee Extension at Wall with Ball  - 2 x daily - 7 x weekly - 1-2 sets - 10 reps - 5-10 sec  hold - Standing Heel Raise with Support  - 1 x daily - 7 x weekly - 1 sets - 10 reps - 1 sec  hold - Sit to Stand  - 2 x daily - 7 x weekly - 1 sets - 10 reps - 3-5 sec  hold - Step Up  - 2 x daily - 7 x weekly - 1 sets - 10 reps - 2 sec  hold - Lateral Step Up  - 2 x daily - 7 x weekly - 2 sets - 10 reps - 2 sec  hold - The Diver  - 2 x daily - 7 x weekly - 1 sets - 10 reps - 2-3 sec  hold - Single Leg Balance with Clock Reach  - 1 x daily - 7 x weekly - 1-2 sets - 10 reps - Standing Terminal Knee Extension with Resistance  - 2 x daily - 7 x weekly - 2-3 sets - 10 reps - 3-5 sec  hold - Side Stepping with Resistance at Thighs  - 1 x daily - 7 x weekly  ASSESSMENT:  CLINICAL IMPRESSION: Patient reports good improvement with L knee pain. Progressing well with strengthening. Gait continues to improve with increased L knee extension and improved weight shift. Continued exercises for stretching and strengthening.   OBJECTIVE IMPAIRMENTS: chronic L knee pain. She has a history of bilat plantar fasciitis which resolved after 5 years but then she noticed L knee pain starting 3/24. Pain has persisted, some days more noticeable than others. Patient present with poor posture and alignment; antalgic gait; decreased ROM/mobility L > R LE; LE weakness; pain limiting functional activity level; abnormal gait, decreased activity tolerance, decreased balance, decreased endurance, decreased mobility, decreased ROM, decreased strength, increased fascial restrictions, impaired flexibility, improper body mechanics, postural dysfunction, obesity, and pain.      GOALS: Goals reviewed with patient? Yes  SHORT TERM GOALS: Target date: 12/17/2022  Independent in HEP  Baseline: Goal  status: INITIAL  2.  L knee ROM 0 deg extension to 110 deg flexion  Baseline:  Goal status: INITIAL   LONG  TERM GOALS: Target date: 01/28/2023   Decrease L knee pain 75=100% allowing patient to return to normal functional activities  Baseline:  Goal status: met  2.  4+/5 to 5/5 strength bilat LE's  Baseline:  Goal status: INITIAL  3.  Ambulation with improved gait pattern without antalgic gait pattern  Baseline:  Goal status: INITIAL  4.  Patient reports return to walking program for fitness walking for 15-20 min with no increase in pain L knee > 1-2/10  Baseline:  Goal status: INITIAL  5.  Independent in HEP including aquatic therapy program as indicated  Baseline:  Goal status: INITIAL  6.  Improve functional limitation score to 65 Baseline: 47 Goal status: INITIAL   PLAN:  PT FREQUENCY: 2x/week  PT DURATION: 12 weeks  PLANNED INTERVENTIONS: Therapeutic exercises, Therapeutic activity, Neuromuscular re-education, Balance training, Gait training, Patient/Family education, Self Care, Joint mobilization, Stair training, Aquatic Therapy, Dry Needling, Electrical stimulation, Cryotherapy, Moist heat, Taping, Ultrasound, Ionotophoresis 4mg /ml Dexamethasone, Manual therapy, and Re-evaluation  PLAN FOR NEXT SESSION: review and progress exercise; manual work, DN, modalities as indicated Progress with strengthening for medial quad, stretch lateral quad/ITB, manual work lateral, distal quad to release muscular tightness at patella; consider taping and/or DN for lateral quad tightness    Zelpha Messing [address] [place], PT 11/25/2022, 3:31 PM

## 2022-12-01 ENCOUNTER — Ambulatory Visit: Payer: BC Managed Care – PPO | Admitting: Rehabilitative and Restorative Service Providers"

## 2022-12-01 DIAGNOSIS — R29898 Other symptoms and signs involving the musculoskeletal system: Secondary | ICD-10-CM

## 2022-12-01 DIAGNOSIS — G8929 Other chronic pain: Secondary | ICD-10-CM

## 2022-12-01 DIAGNOSIS — M25562 Pain in left knee: Secondary | ICD-10-CM | POA: Diagnosis not present

## 2022-12-01 DIAGNOSIS — R2689 Other abnormalities of gait and mobility: Secondary | ICD-10-CM

## 2022-12-01 DIAGNOSIS — M6281 Muscle weakness (generalized): Secondary | ICD-10-CM

## 2022-12-01 NOTE — Therapy (Signed)
OUTPATIENT PHYSICAL THERAPY LOWER EXTREMITY TREATMENT    Patient Name: [name] MRN: [id number] DOB:07/15/1975, 47 y.o., female Today's Date: 12/01/2022  END OF SESSION:  PT End of Session - 12/01/22 1615     Visit Number 7    Number of Visits 24    Date for PT Re-Evaluation 01/28/23    Authorization Type state health plan 70/30 copay    PT Start Time 1615    PT Stop Time 1700    PT Time Calculation (min) 45 min    Activity Tolerance Patient tolerated treatment well             Past Medical History:  Diagnosis Date   Hypertension    IBS (irritable bowel syndrome)    PCOS (polycystic ovarian syndrome)    Past Surgical History:  Procedure Laterality Date   CESAREAN SECTION  2008   CESAREAN SECTION  02/02/2011   Procedure: CESAREAN SECTION;  Surgeon: Jeani Hawking, MD;  Location: [facility];  Service: Gynecology;  Laterality: N/A;   ESSURE TUBAL LIGATION  03/26/11   Patient Active Problem List   Diagnosis Date Noted   Chronic pain of left knee 10/22/2022   Abnormal CT of the abdomen 12/08/2021   Snoring 12/22/2019   IFG (impaired fasting glucose) 10/01/2017   Family history of pancreatic cancer 12/05/2015   Essential hypertension 06/24/2015   S/P cesarean section 02/02/2011   POLYCYSTIC OVARIAN DISEASE 03/26/2008   Hyperlipidemia 03/26/2008   COMMON MIGRAINE 03/26/2008   IBS 03/26/2008    PCP: Dr Nani Gasser  REFERRING PROVIDER: Dr Rodney Langton   REFERRING DIAG: Chronic Lt knee pain   THERAPY DIAG:  Chronic pain of left knee  Other symptoms and signs involving the musculoskeletal system  Muscle weakness (generalized)  Other abnormalities of gait and mobility  Rationale for Evaluation and Treatment: Rehabilitation  ONSET DATE: 07/24/22  SUBJECTIVE:   SUBJECTIVE STATEMENT: L knee is doing well. She has no pain and is walking much faster. She has some soreness in the L knee with full extension. That is getting better but she can tell  that the knee is still a little tight. No problem with the exercises. Pleased with progress. She has been working on her exercises at home. She has massage stick for home now and that works well. Does not want to go to pool for aquatic exercise instruction. Land based exercises are going well. RTD 12/02/22 Wednesday   EVAL: Patient reports that she went to [facility] 2019 and developed bilat plantar fasciitis with resolution of plantar fasciitis but she has severe pain in the Lt knee since 3/24. Pain is primarily around the knee cap and on both sides of the knees. She feels soreness in the back of calf and knee at times.   PERTINENT HISTORY: HTN; headaches; bilat plantar fasciitis x 5 yrs now resolved  PAIN:  Are you having pain? Yes: NPRS scale: 0/10; worst 1-2/10(sore) Pain location: Lt knee  Pain description: dull  Aggravating factors: walking; steps; squatting; bending; lifting   Relieving factors: meds   PRECAUTIONS: Knee  WEIGHT BEARING RESTRICTIONS: No  FALLS:  Has patient fallen in last 6 months? Yes. Number of falls 1 - no injury  LIVING ENVIRONMENT: Lives with: lives with their spouse Lives in: House/apartment Stairs: Yes: External: 1 steps; none Has following equipment at home: None  OCCUPATION: Comptroller - some walking; desk work and up/down; Building control surveyor x 17 yrs   Household chores; child care   PATIENT GOALS:  get rid of the pain and return to walking   NEXT MD VISIT: 12/02/22  OBJECTIVE:   DIAGNOSTIC FINDINGS: xray 10/22/22 - No evidence of fracture, dislocation, or joint effusion. No evidence of arthropathy or other focal bone abnormality. Soft tissues are unremarkable.  PATIENT SURVEYS:  FOTO 47; goal 65 12/01/22: 65  EDEMA:  Minor edema on an intermittent basis   Balance:   R 8 sec; L 6 sec   12/01/22: R ~ 15 sec; L ~ 12 sec minimal UE support no pain   MUSCLE LENGTH: Hamstrings: Right 90 deg; Left 80 deg Thomas test: tight hip flexors  Quad prone  flexion: R 90; L 90  POSTURE: rounded shoulders, forward head, flexed trunk , and L knee flexed/R knee hyperextended   PALPATION: Muscular tightness lateral distal quad to lateral proximal calf   LOWER EXTREMITY ROM:  Active ROM Right eval Left eval Left  12/01/22  Hip flexion Tight  Tight  WFL  Hip extension Tight  Tight  WFL  Hip abduction     Hip adduction     Hip internal rotation     Hip external rotation     Knee flexion 112 103 104  Knee extension 5 -9 0  Ankle dorsiflexion Tight  Tight  WFL's   Ankle plantarflexion     Ankle inversion     Ankle eversion      (Blank rows = not tested)  LOWER EXTREMITY MMT:  MMT Right eval Left eval Left 12/01/22  Hip flexion 5 4 5   Hip extension 4 4- 5  Hip abduction 5 4 5   Hip adduction     Hip internal rotation     Hip external rotation     Knee flexion 5 4+ 5  Knee extension 5 4 painful  5  Ankle dorsiflexion     Ankle plantarflexion 4 3 painful 4 pain L knee   Ankle inversion     Ankle eversion      (Blank rows = not tested)  LOWER EXTREMITY SPECIAL TESTS:  Hip special tests: Piriformis test: positive tight L  FUNCTIONAL TESTS:  5 times sit to stand: 15.89 sec with pain in L knee  12/01/22: 13.89 no pain   GAIT: Distance walked: 40 Assistive device utilized: None Level of assistance: Complete Independence Comments: antalgic gait L in Wt bearing L  12/01/22: improving gait pattern minimal to no limp L LE    [facility] Adult PT Treatment:                                                DATE: 12/01/22 Therapeutic Exercise: Nustep L6 x 5 min (seat 4) Standing  Gastroc stretch 30 sec x 2 R/L Soleus stretch 30 sec x 2 R/L Wall squat 5 sec x 10  Single leg stance 20 sec x 2 R/L TKE blue TB x 3 sec x 10 TKE pressing into ball 4 sec x 10  Step up 6" L/R x 10 Lateral step 4" L/R x 10  3 way touch on slider x 5 R/L Diver forward to touch chair seat x 6 R/ 10 L   Side steps green TB above knees x 10 feet x 5  Forward step  down/backward up x 1 Modified dead lift form 6 " x 5  Prone  Quad stretch 30 sec x 2 Glut set  5 sec x 10 Supine  HS stretch 30 sec x 2  ITB stretch w/ strap 30 sec x 2  Quad set heel propped on foam roll 5 sec x 10  SAQ 5 sec x 10  Alternate windshield wiper in hook lying x 15-20 Hip abduction isometric green TB 3 sec x 10 alternating R/L  Sitting  Hamstring stretch 30 sec x 2 R/L Sit to stand x 10  Modalities: Ice at home  Self Care: (HOME) Avoid standing with knees hyperextended  Use of topical analgesic for pain management   [facility] Adult PT Treatment:                                                DATE: 11/25/22 Therapeutic Exercise: Nustep L5 x 5 min (seat 4) Standing  Gastroc stretch 30 sec x 2 R/L Soleus stretch 30 sec x 2 R/L Wall squat 5 sec x 10  Single leg stance 20 sec x 2 R/L TKE blue TB x 3 sec x 6 Step up 4" L/R x 10 Lateral step 4" L/R x 10  3 way touch on slider x 5 R/L Diver frw to touch chair seat x 6 R/ 10 L   Side steps green TB above knees x 10 feet x 5  Prone  Quad stretch 30 sec x 2 Glut set 5 sec x 10 Supine  HS stretch 30 sec x 2  ITB stretch w/ strap 30 sec x 2  Alternate windshield wiper in hook lying x 15-20 Hip abduction isometric green TB 3 sec x 10 alternating R/L  Sitting  Hamstring stretch 30 sec x 2 R/L Sit to stand x 10  Modalities: Ice at home  Self Care: Avoid standing with knees hyperextended  Use of topical analgesic for pain management    PATIENT EDUCATION:  Education details: POC; HEP  Person educated: Patient Education method: Explanation, Demonstration, Tactile cues, Verbal cues, and Handouts Education comprehension: verbalized understanding, returned demonstration, verbal cues required, tactile cues required, and needs further education  HOME EXERCISE PROGRAM: Access Code: [email or web address] URL: [email or web address] Date: 12/01/2022 Prepared by: Corlis Leak  Exercises - Prone Quadriceps Stretch with Strap   - 1 x daily - 7 x weekly - 1 sets - 3 reps - 30 sec  hold - Prone Quadriceps Set  - 1 x daily - 7 x weekly - 1-2 sets - 10 reps - 5-10 sec  hold - Hooklying Hamstring Stretch with Strap  - 1 x daily - 7 x weekly - 1 sets - 3 reps - 30 sec  hold - Supine ITB Stretch with Strap  - 1 x daily - 7 x weekly - 1 sets - 3 reps - 30 sec  hold - Supine Quad Set  - 1 x daily - 7 x weekly - 1 sets - 10 reps - 3 sec  hold - Straight Leg Raise with External Rotation  - 1 x daily - 7 x weekly - 1 sets - 10 reps - 3-5 sec  hold - Seated Hamstring Stretch  - 1 x daily - 7 x weekly - 1 sets - 3 reps - 30 sec  hold - Gastroc Stretch on Wall  - 1 x daily - 7 x weekly - 1 sets - 3 reps - 30 sec  hold - Soleus Stretch on Wall  - 1  x daily - 7 x weekly - 1 sets - 3 reps - 30 sec  hold - Wall Quarter Squat  - 1 x daily - 7 x weekly - 1-2 sets - 10 reps - 5-10 sec  hold - Single Leg Stance with Support  - 1 x daily - 7 x weekly - 1 sets - 3 reps - 30 sec  hold - Prone Gluteal Sets  - 1 x daily - 7 x weekly - 1 sets - 10 reps - 5-10 sec  hold - Prone Hip Extension  - 1 x daily - 7 x weekly - 1 sets - 5-10 reps - 5 sec  hold - Hooklying Isometric Clamshell  - 1 x daily - 7 x weekly - 1 sets - 10 reps - 3 sec  hold - Standing Terminal Knee Extension at Wall with Ball  - 1 x daily - 7 x weekly - 1-2 sets - 10 reps - 5-10 sec  hold - Standing Heel Raise with Support  - 1 x daily - 7 x weekly - 1 sets - 10 reps - 1 sec  hold - Sit to Stand  - 1 x daily - 7 x weekly - 1 sets - 10 reps - 3-5 sec  hold - Step Up  - 1 x daily - 7 x weekly - 1 sets - 10 reps - 2 sec  hold - Lateral Step Up  - 1 x daily - 7 x weekly - 2 sets - 10 reps - 2 sec  hold - The Diver  - 1 x daily - 7 x weekly - 1 sets - 10 reps - 2-3 sec  hold - Single Leg Balance with Clock Reach  - 1 x daily - 7 x weekly - 1-2 sets - 10 reps - Standing Terminal Knee Extension with Resistance  - 1 x daily - 7 x weekly - 2-3 sets - 10 reps - 3-5 sec  hold - Side Stepping  with Resistance at Thighs  - 1 x daily - 7 x weekly - Quad Setting and Stretching  - 1 x daily - 7 x weekly - 1 sets - 5-10 reps - 5-10 sec  hold - Supine Knee Extension Strengthening  - 1 x daily - 7 x weekly - 1-2 sets - 10 reps - 5 sec  hold - Backward Step Up  - 1 x daily - 7 x weekly - 1-3 sets - 10 reps - Forward T  - 1 x daily - 7 x weekly - 1-2 sets - 10 reps - 2-3 sec  hold - Standing Heel Raise with Support  - 1 x daily - 7 x weekly - 1 sets - 10 reps - 1 sec  hold - Sidelying Hip Abduction  - 1 x daily - 7 x weekly - 3 sets - 10 reps - 3-5 sec  hold - Modified Deadlift with Pelvic Contraction  - 1 x daily - 7 x weekly - 1 sets - 10 reps  ASSESSMENT:  CLINICAL IMPRESSION: Patient reports good improvement with L knee pain. She demonstrates increased strength, increased ROM and improved functional activity level. Gait continues to improve with increased L knee extension and improved weight shift. Continued exercises for stretching  with focus on terminal knee extension and strengthening. Note to MD.   OBJECTIVE IMPAIRMENTS: chronic L knee pain. She has a history of bilat plantar fasciitis which resolved after 5 years but then she noticed L knee pain starting 3/24.  Pain has persisted, some days more noticeable than others. Patient present with poor posture and alignment; antalgic gait; decreased ROM/mobility L > R LE; LE weakness; pain limiting functional activity level; abnormal gait, decreased activity tolerance, decreased balance, decreased endurance, decreased mobility, decreased ROM, decreased strength, increased fascial restrictions, impaired flexibility, improper body mechanics, postural dysfunction, obesity, and pain.      GOALS: Goals reviewed with patient? Yes  SHORT TERM GOALS: Target date: 12/17/2022  Independent in HEP  Baseline: Goal status: met  2.  L knee ROM 0 deg extension to 110 deg flexion  Baseline:  Goal status: met   LONG TERM GOALS: Target date:  01/28/2023   Decrease L knee pain 75=100% allowing patient to return to normal functional activities  Baseline:  Goal status: met  2.  4+/5 to 5/5 strength bilat LE's  Baseline:  Goal status: met  3.  Ambulation with improved gait pattern without antalgic gait pattern  Baseline:  Goal status: met  4.  Patient reports return to walking program for fitness walking for 15-20 min with no increase in pain L knee > 1-2/10  Baseline:  Goal status: on going   5.  Independent in HEP including aquatic therapy program as indicated  Baseline:  Goal status: on going   6.  Improve functional limitation score to 65 Baseline: 47 12/01/22: 65 Goal status: met   PLAN:  PT FREQUENCY: 2x/week  PT DURATION: 12 weeks  PLANNED INTERVENTIONS: Therapeutic exercises, Therapeutic activity, Neuromuscular re-education, Balance training, Gait training, Patient/Family education, Self Care, Joint mobilization, Stair training, Aquatic Therapy, Dry Needling, Electrical stimulation, Cryotherapy, Moist heat, Taping, Ultrasound, Ionotophoresis 4mg /ml Dexamethasone, Manual therapy, and Re-evaluation  PLAN FOR NEXT SESSION: review and progress exercise; manual work, DN, modalities as indicated Progress with strengthening for medial quad, stretch lateral quad/ITB, manual work lateral, distal quad to release muscular tightness at patella; consider taping and/or DN for lateral quad tightness    Shakema Surita P Eknoor [place], PT 12/01/2022, 4:18 PM

## 2022-12-02 ENCOUNTER — Ambulatory Visit: Payer: BC Managed Care – PPO | Admitting: Sports Medicine

## 2022-12-02 DIAGNOSIS — M25562 Pain in left knee: Secondary | ICD-10-CM

## 2022-12-02 DIAGNOSIS — G8929 Other chronic pain: Secondary | ICD-10-CM | POA: Diagnosis not present

## 2022-12-02 NOTE — Assessment & Plan Note (Signed)
Very pleasant 47 year old female, chronic left medial knee pain, worse with weightbearing with moderate gelling, we treated her conservatively with meloxicam, formal PT. She has done fantastic, she is continuing to do the PT and she is for the most part pain-free, she will continue the home conditioning and strengthening indefinitely for the rest of her life, and she can return to see me as needed.

## 2022-12-02 NOTE — Progress Notes (Signed)
    Procedures performed today:    None.  Independent interpretation of notes and tests performed by another provider:   None.  Brief History, Exam, Impression, and Recommendations:    Chronic pain of left knee Very pleasant 47 year old female, chronic left medial knee pain, worse with weightbearing with moderate gelling, we treated her conservatively with meloxicam, formal PT. She has done fantastic, she is continuing to do the PT and she is for the most part pain-free, she will continue the home conditioning and strengthening indefinitely for the rest of her life, and she can return to see me as needed.    ____________________________________________ Ihor Austin. Benjamin Stain, M.D., ABFM., CAQSM., AME. Primary Care and Sports Medicine [facility]  Adjunct Professor of Family Medicine  [employer]  Restaurant manager, fast food

## 2022-12-03 ENCOUNTER — Ambulatory Visit: Payer: BC Managed Care – PPO

## 2022-12-03 DIAGNOSIS — R2689 Other abnormalities of gait and mobility: Secondary | ICD-10-CM

## 2022-12-03 DIAGNOSIS — R29898 Other symptoms and signs involving the musculoskeletal system: Secondary | ICD-10-CM

## 2022-12-03 DIAGNOSIS — G8929 Other chronic pain: Secondary | ICD-10-CM

## 2022-12-03 DIAGNOSIS — M25562 Pain in left knee: Secondary | ICD-10-CM | POA: Diagnosis not present

## 2022-12-03 DIAGNOSIS — M6281 Muscle weakness (generalized): Secondary | ICD-10-CM

## 2022-12-03 NOTE — Therapy (Addendum)
OUTPATIENT PHYSICAL THERAPY LOWER EXTREMITY TREATMENT and DISCHARGE SUMMARY   PHYSICAL THERAPY DISCHARGE SUMMARY  Visits from Start of Care: 8  Current functional level related to goals / functional outcomes: Independent in HEP. Goals of therapy accomplished    Remaining deficits: Needs to continue with HEP   Education / Equipment: HEP    Patient agrees to discharge. Patient goals were met. Patient is being discharged due to meeting the stated rehab goals.  Celyn P. Leonor Liv PT, MPH 12/07/22 8:33 AM   Patient Name: OTIE HEADLEE MRN: [id number] DOB:15-May-1976, 47 y.o., female Today's Date: 12/03/2022  END OF SESSION:  PT End of Session - 12/03/22 1535     Visit Number 8    Number of Visits 24    Date for PT Re-Evaluation 01/28/23    Authorization Type state health plan 70/30 copay    PT Start Time 1535    PT Stop Time 1613    PT Time Calculation (min) 38 min    Activity Tolerance Patient tolerated treatment well    Behavior During Therapy WFL for tasks assessed/performed             Past Medical History:  Diagnosis Date   Hypertension    IBS (irritable bowel syndrome)    PCOS (polycystic ovarian syndrome)    Past Surgical History:  Procedure Laterality Date   CESAREAN SECTION  2008   CESAREAN SECTION  02/02/2011   Procedure: CESAREAN SECTION;  Surgeon: Jeani Hawking, MD;  Location: [facility];  Service: Gynecology;  Laterality: N/A;   ESSURE TUBAL LIGATION  03/26/11   Patient Active Problem List   Diagnosis Date Noted   Chronic pain of left knee 10/22/2022   Abnormal CT of the abdomen 12/08/2021   Snoring 12/22/2019   IFG (impaired fasting glucose) 10/01/2017   Family history of pancreatic cancer 12/05/2015   Essential hypertension 06/24/2015   S/P cesarean section 02/02/2011   POLYCYSTIC OVARIAN DISEASE 03/26/2008   Hyperlipidemia 03/26/2008   COMMON MIGRAINE 03/26/2008   IBS 03/26/2008    PCP: Dr Nani Gasser  REFERRING PROVIDER: Dr Rodney Langton   REFERRING DIAG: Chronic Lt knee pain   THERAPY DIAG:  Chronic pain of left knee  Muscle weakness (generalized)  Other symptoms and signs involving the musculoskeletal system  Other abnormalities of gait and mobility  Rationale for Evaluation and Treatment: Rehabilitation  ONSET DATE: 07/24/22  SUBJECTIVE:   SUBJECTIVE STATEMENT: Patient reports she received good report from MD, who recommended she focus on working with exercises independently. Patient requests today be here last day of PT, states she has no pain and less soreness with knee extension.   EVAL: Patient reports that she went to [facility] 2019 and developed bilat plantar fasciitis with resolution of plantar fasciitis but she has severe pain in the Lt knee since 3/24. Pain is primarily around the knee cap and on both sides of the knees. She feels soreness in the back of calf and knee at times.   PERTINENT HISTORY: HTN; headaches; bilat plantar fasciitis x 5 yrs now resolved  PAIN:  Are you having pain? Yes: NPRS scale: 0/10; worst 1-2/10(sore) Pain location: Lt knee  Pain description: dull  Aggravating factors: walking; steps; squatting; bending; lifting   Relieving factors: meds   PRECAUTIONS: Knee  WEIGHT BEARING RESTRICTIONS: No  FALLS:  Has patient fallen in last 6 months? Yes. Number of falls 1 - no injury  LIVING ENVIRONMENT: Lives with: lives with their spouse Lives in: House/apartment  Stairs: Yes: External: 1 steps; none Has following equipment at home: None  OCCUPATION: librarian - some walking; desk work and up/down; Building control surveyor x 17 yrs   Household chores; child care   PATIENT GOALS: get rid of the pain and return to walking   NEXT MD VISIT: 12/02/22  OBJECTIVE:   DIAGNOSTIC FINDINGS: xray 10/22/22 - No evidence of fracture, dislocation, or joint effusion. No evidence of arthropathy or other focal bone abnormality. Soft tissues are unremarkable.  PATIENT SURVEYS:  FOTO 47;  goal 65 12/01/22: 65  EDEMA:  Minor edema on an intermittent basis   Balance:   R 8 sec; L 6 sec   12/01/22: R ~ 15 sec; L ~ 12 sec minimal UE support no pain   MUSCLE LENGTH: Hamstrings: Right 90 deg; Left 80 deg Thomas test: tight hip flexors  Quad prone flexion: R 90; L 90  POSTURE: rounded shoulders, forward head, flexed trunk , and L knee flexed/R knee hyperextended   PALPATION: Muscular tightness lateral distal quad to lateral proximal calf   LOWER EXTREMITY ROM:  Active ROM Right eval Left eval Left  12/01/22  Hip flexion Tight  Tight  WFL  Hip extension Tight  Tight  WFL  Hip abduction     Hip adduction     Hip internal rotation     Hip external rotation     Knee flexion 112 103 104  Knee extension 5 -9 0  Ankle dorsiflexion Tight  Tight  WFL's   Ankle plantarflexion     Ankle inversion     Ankle eversion      (Blank rows = not tested)  LOWER EXTREMITY MMT:  MMT Right eval Left eval Left 12/01/22  Hip flexion 5 4 5   Hip extension 4 4- 5  Hip abduction 5 4 5   Hip adduction     Hip internal rotation     Hip external rotation     Knee flexion 5 4+ 5  Knee extension 5 4 painful  5  Ankle dorsiflexion     Ankle plantarflexion 4 3 painful 4 pain L knee   Ankle inversion     Ankle eversion      (Blank rows = not tested)  LOWER EXTREMITY SPECIAL TESTS:  Hip special tests: Piriformis test: positive tight L  FUNCTIONAL TESTS:  5 times sit to stand: 15.89 sec with pain in L knee  12/01/22: 13.89 no pain   GAIT: Distance walked: 40 Assistive device utilized: None Level of assistance: Complete Independence Comments: antalgic gait L in Wt bearing L  12/01/22: improving gait pattern minimal to no limp L LE    [facility] Adult PT Treatment:                                                DATE: 12/03/2022 Therapeutic Exercise: NuStep L6 x 5 min Wall squat 10x5" Modified single leg deadlift x15 (L) TKE BTB 10x5" Resisted side stepping GTB crossed at ankles  4x15' Supine HS/ITB stretches x30" each Prone quad stretch w/strap 2x30" 3-way lunges with slider  6" step up L/R x15 Gastroc stretch on slantboard 2x30" Diver forward touch to chair seat x10 B   [facility] Adult PT Treatment:  DATE: 12/01/22 Therapeutic Exercise: Nustep L6 x 5 min (seat 4) Standing  Gastroc stretch 30 sec x 2 R/L Soleus stretch 30 sec x 2 R/L Wall squat 5 sec x 10  Single leg stance 20 sec x 2 R/L TKE blue TB x 3 sec x 10 TKE pressing into ball 4 sec x 10  Step up 6" L/R x 10 Lateral step 4" L/R x 10  3 way touch on slider x 5 R/L Diver forward to touch chair seat x 6 R/ 10 L   Side steps green TB above knees x 10 feet x 5  Forward step down/backward up x 1 Modified dead lift form 6 " x 5  Prone  Quad stretch 30 sec x 2 Glut set 5 sec x 10 Supine  HS stretch 30 sec x 2  ITB stretch w/ strap 30 sec x 2  Quad set heel propped on foam roll 5 sec x 10  SAQ 5 sec x 10  Alternate windshield wiper in hook lying x 15-20 Hip abduction isometric green TB 3 sec x 10 alternating R/L  Sitting  Hamstring stretch 30 sec x 2 R/L Sit to stand x 10  Modalities: Ice at home  Self Care: (HOME) Avoid standing with knees hyperextended  Use of topical analgesic for pain management    [facility] Adult PT Treatment:                                                DATE: 11/25/22 Therapeutic Exercise: Nustep L5 x 5 min (seat 4) Standing  Gastroc stretch 30 sec x 2 R/L Soleus stretch 30 sec x 2 R/L Wall squat 5 sec x 10  Single leg stance 20 sec x 2 R/L TKE blue TB x 3 sec x 6 Step up 4" L/R x 10 Lateral step 4" L/R x 10  3 way touch on slider x 5 R/L Diver frw to touch chair seat x 6 R/ 10 L   Side steps green TB above knees x 10 feet x 5  Prone  Quad stretch 30 sec x 2 Glut set 5 sec x 10 Supine  HS stretch 30 sec x 2  ITB stretch w/ strap 30 sec x 2  Alternate windshield wiper in hook lying x 15-20 Hip abduction isometric green  TB 3 sec x 10 alternating R/L  Sitting  Hamstring stretch 30 sec x 2 R/L Sit to stand x 10  Modalities: Ice at home  Self Care: Avoid standing with knees hyperextended  Use of topical analgesic for pain management    PATIENT EDUCATION:  Education details: POC; HEP  Person educated: Patient Education method: Explanation, Demonstration, Tactile cues, Verbal cues, and Handouts Education comprehension: verbalized understanding, returned demonstration, verbal cues required, tactile cues required, and needs further education  HOME EXERCISE PROGRAM: Access Code: [email or web address] URL: [email or web address] Date: 12/01/2022 Prepared by: Corlis Leak  Exercises - Prone Quadriceps Stretch with Strap  - 1 x daily - 7 x weekly - 1 sets - 3 reps - 30 sec  hold - Prone Quadriceps Set  - 1 x daily - 7 x weekly - 1-2 sets - 10 reps - 5-10 sec  hold - Hooklying Hamstring Stretch with Strap  - 1 x daily - 7 x weekly - 1 sets - 3 reps - 30 sec  hold - Supine ITB  Stretch with Strap  - 1 x daily - 7 x weekly - 1 sets - 3 reps - 30 sec  hold - Supine Quad Set  - 1 x daily - 7 x weekly - 1 sets - 10 reps - 3 sec  hold - Straight Leg Raise with External Rotation  - 1 x daily - 7 x weekly - 1 sets - 10 reps - 3-5 sec  hold - Seated Hamstring Stretch  - 1 x daily - 7 x weekly - 1 sets - 3 reps - 30 sec  hold - Gastroc Stretch on Wall  - 1 x daily - 7 x weekly - 1 sets - 3 reps - 30 sec  hold - Soleus Stretch on Wall  - 1 x daily - 7 x weekly - 1 sets - 3 reps - 30 sec  hold - Wall Quarter Squat  - 1 x daily - 7 x weekly - 1-2 sets - 10 reps - 5-10 sec  hold - Single Leg Stance with Support  - 1 x daily - 7 x weekly - 1 sets - 3 reps - 30 sec  hold - Prone Gluteal Sets  - 1 x daily - 7 x weekly - 1 sets - 10 reps - 5-10 sec  hold - Prone Hip Extension  - 1 x daily - 7 x weekly - 1 sets - 5-10 reps - 5 sec  hold - Hooklying Isometric Clamshell  - 1 x daily - 7 x weekly - 1 sets - 10 reps - 3 sec  hold -  Standing Terminal Knee Extension at Wall with Ball  - 1 x daily - 7 x weekly - 1-2 sets - 10 reps - 5-10 sec  hold - Standing Heel Raise with Support  - 1 x daily - 7 x weekly - 1 sets - 10 reps - 1 sec  hold - Sit to Stand  - 1 x daily - 7 x weekly - 1 sets - 10 reps - 3-5 sec  hold - Step Up  - 1 x daily - 7 x weekly - 1 sets - 10 reps - 2 sec  hold - Lateral Step Up  - 1 x daily - 7 x weekly - 2 sets - 10 reps - 2 sec  hold - The Diver  - 1 x daily - 7 x weekly - 1 sets - 10 reps - 2-3 sec  hold - Single Leg Balance with Clock Reach  - 1 x daily - 7 x weekly - 1-2 sets - 10 reps - Standing Terminal Knee Extension with Resistance  - 1 x daily - 7 x weekly - 2-3 sets - 10 reps - 3-5 sec  hold - Side Stepping with Resistance at Thighs  - 1 x daily - 7 x weekly - Quad Setting and Stretching  - 1 x daily - 7 x weekly - 1 sets - 5-10 reps - 5-10 sec  hold - Supine Knee Extension Strengthening  - 1 x daily - 7 x weekly - 1-2 sets - 10 reps - 5 sec  hold - Backward Step Up  - 1 x daily - 7 x weekly - 1-3 sets - 10 reps - Forward T  - 1 x daily - 7 x weekly - 1-2 sets - 10 reps - 2-3 sec  hold - Standing Heel Raise with Support  - 1 x daily - 7 x weekly - 1 sets - 10 reps - 1  sec  hold - Sidelying Hip Abduction  - 1 x daily - 7 x weekly - 3 sets - 10 reps - 3-5 sec  hold - Modified Deadlift with Pelvic Contraction  - 1 x daily - 7 x weekly - 1 sets - 10 reps  ASSESSMENT:  CLINICAL IMPRESSION: Patient able to complete all exercises with no exacerbation of knee pain or discomfort. Patient able to navigate stairs with no pain in knee. Cueing alleviated lumbar discomfort during diver exercise.    OBJECTIVE IMPAIRMENTS: chronic L knee pain. She has a history of bilat plantar fasciitis which resolved after 5 years but then she noticed L knee pain starting 3/24. Pain has persisted, some days more noticeable than others. Patient present with poor posture and alignment; antalgic gait; decreased ROM/mobility  L > R LE; LE weakness; pain limiting functional activity level; abnormal gait, decreased activity tolerance, decreased balance, decreased endurance, decreased mobility, decreased ROM, decreased strength, increased fascial restrictions, impaired flexibility, improper body mechanics, postural dysfunction, obesity, and pain.      GOALS: Goals reviewed with patient? Yes  SHORT TERM GOALS: Target date: 12/17/2022  Independent in HEP  Baseline: Goal status: met  2.  L knee ROM 0 deg extension to 110 deg flexion  Baseline:  Goal status: met   LONG TERM GOALS: Target date: 01/28/2023  Decrease L knee pain 75=100% allowing patient to return to normal functional activities  Baseline:  Goal status: met  2.  4+/5 to 5/5 strength bilat LE's  Baseline:  Goal status: met  3.  Ambulation with improved gait pattern without antalgic gait pattern  Baseline:  Goal status: met  4.  Patient reports return to walking program for fitness walking for 15-20 min with no increase in pain L knee > 1-2/10  Baseline: occasional knee soreness, no pain Goal status: MET  5.  Independent in HEP including aquatic therapy program as indicated  Baseline:  Goal status: MET   6.  Improve functional limitation score to 65 Baseline: 47 12/01/22: 65 Goal status: met   PLAN:  PT FREQUENCY: 2x/week  PT DURATION: 12 weeks  PLANNED INTERVENTIONS: Therapeutic exercises, Therapeutic activity, Neuromuscular re-education, Balance training, Gait training, Patient/Family education, Self Care, Joint mobilization, Stair training, Aquatic Therapy, Dry Needling, Electrical stimulation, Cryotherapy, Moist heat, Taping, Ultrasound, Ionotophoresis 4mg /ml Dexamethasone, Manual therapy, and Re-evaluation  PLAN FOR NEXT SESSION: Discharge    Sanjuana Mae, PTA 12/03/2022, 4:15 PM

## 2022-12-16 ENCOUNTER — Other Ambulatory Visit: Payer: Self-pay | Admitting: Family Medicine

## 2022-12-16 DIAGNOSIS — I10 Essential (primary) hypertension: Secondary | ICD-10-CM

## 2022-12-21 ENCOUNTER — Encounter: Payer: Self-pay | Admitting: Family Medicine

## 2022-12-21 ENCOUNTER — Ambulatory Visit: Payer: BC Managed Care – PPO | Admitting: Family Medicine

## 2022-12-21 VITALS — BP 133/75 | HR 71 | Ht 60.0 in | Wt 250.0 lb

## 2022-12-21 DIAGNOSIS — Z7689 Persons encountering health services in other specified circumstances: Secondary | ICD-10-CM

## 2022-12-21 DIAGNOSIS — R7301 Impaired fasting glucose: Secondary | ICD-10-CM | POA: Diagnosis not present

## 2022-12-21 DIAGNOSIS — I10 Essential (primary) hypertension: Secondary | ICD-10-CM | POA: Diagnosis not present

## 2022-12-21 LAB — POCT GLYCOSYLATED HEMOGLOBIN (HGB A1C): Hemoglobin A1C: 5.6 % (ref 4.0–5.6)

## 2022-12-21 NOTE — Assessment & Plan Note (Signed)
A1C looks great today at 5.6 she is really doing well with some of the dietary changes that she is made recently just continue to work on healthy food choices and increasing activity level as tolerated.  Lab Results  Component Value Date   HGBA1C 5.6 12/21/2022

## 2022-12-21 NOTE — Assessment & Plan Note (Signed)
Well controlled. Continue current regimen. Follow up in  6 mo  

## 2022-12-21 NOTE — Assessment & Plan Note (Signed)
We did review the options the only one that would be reasonably affordable would be maybe the Qsymia and even that is quite expensive.  We did discuss maybe doing this medication separately.  She says for now she wants to continue to work on diet and exercise for the next several months and see if she can continue to make some progress.  But I am happy to discuss further if she would like.  Otherwise continue current regimen.

## 2022-12-21 NOTE — Progress Notes (Signed)
Established Patient Office Visit  Subjective   Patient ID: [name], female    DOB: 08-27-1975  Age: 47 y.o. MRN: [id number]  Chief Complaint  Patient presents with   Hypertension   ifg    HPI  Hypertension- Pt denies chest pain, SOB, dizziness, or heart palpitations.  Taking meds as directed w/o problems.  Denies medication side effects.    Impaired fasting glucose-no increased thirst or urination. No symptoms consistent with hypoglycemia.  He has made some healthy changes she is truly tried her to cut back on carb intake she has not been eating out as often and she has been trying to walk more consistently.  Now that her knee is a lot better she feels like she is starting to make a little progress but says she always is able to lose between 10 to 15 pounds but then will plateau and then will tend to gain it back.  Interested possibly in weight loss medications.  She did call her insurance plan to see what they would cover.    ROS    Objective:     BP 133/75   Pulse 71   Ht 5' (1.524 m)   Wt 250 lb (113.4 kg)   SpO2 97%   BMI 48.82 kg/m    Physical Exam Vitals and nursing note reviewed.  Constitutional:      Appearance: She is well-developed.  HENT:     Head: Normocephalic and atraumatic.  Cardiovascular:     Rate and Rhythm: Normal rate and regular rhythm.     Heart sounds: Normal heart sounds.  Pulmonary:     Effort: Pulmonary effort is normal.     Breath sounds: Normal breath sounds.  Skin:    General: Skin is warm and dry.  Neurological:     Mental Status: She is alert and oriented to person, place, and time.  Psychiatric:        Behavior: Behavior normal.      Results for orders placed or performed in visit on 12/21/22  POCT glycosylated hemoglobin (Hb A1C)  Result Value Ref Range   Hemoglobin A1C 5.6 4.0 - 5.6 %   HbA1c POC (<> result, manual entry)     HbA1c, POC (prediabetic range)     HbA1c, POC (controlled diabetic range)         The 10-year ASCVD risk score (Arnett DK, et al., 2019) is: 1.3%    Assessment & Plan:   Problem List Items Addressed This Visit       Cardiovascular and Mediastinum   Essential hypertension - Primary    Well controlled. Continue current regimen. Follow up in  23mo       Relevant Orders   POCT glycosylated hemoglobin (Hb A1C) (Completed)   Basic Metabolic Panel (BMET)     Endocrine   IFG (impaired fasting glucose)    A1C looks great today at 5.6 she is really doing well with some of the dietary changes that she is made recently just continue to work on healthy food choices and increasing activity level as tolerated.  Lab Results  Component Value Date   HGBA1C 5.6 12/21/2022         Relevant Orders   POCT glycosylated hemoglobin (Hb A1C) (Completed)   Basic Metabolic Panel (BMET)     Other   Encounter for weight management    We did review the options the only one that would be reasonably affordable would be maybe the Qsymia and even  that is quite expensive.  We did discuss maybe doing this medication separately.  She says for now she wants to continue to work on diet and exercise for the next several months and see if she can continue to make some progress.  But I am happy to discuss further if she would like.  Otherwise continue current regimen.       Return in about 6 months (around 06/23/2023) for Hypertension.    Nani Gasser, MD

## 2022-12-22 NOTE — Progress Notes (Signed)
Your lab work is within acceptable range and there are no concerning findings.   ?

## 2023-01-27 ENCOUNTER — Other Ambulatory Visit: Payer: Self-pay | Admitting: Family Medicine

## 2023-01-27 DIAGNOSIS — I10 Essential (primary) hypertension: Secondary | ICD-10-CM

## 2023-02-10 ENCOUNTER — Other Ambulatory Visit: Payer: Self-pay | Admitting: Sports Medicine

## 2023-02-10 DIAGNOSIS — G8929 Other chronic pain: Secondary | ICD-10-CM

## 2023-02-25 ENCOUNTER — Encounter: Payer: Self-pay | Admitting: Family Medicine

## 2023-02-25 ENCOUNTER — Ambulatory Visit: Payer: BC Managed Care – PPO | Admitting: Family Medicine

## 2023-02-25 VITALS — BP 124/78 | HR 78 | Ht 60.0 in | Wt 255.0 lb

## 2023-02-25 DIAGNOSIS — L989 Disorder of the skin and subcutaneous tissue, unspecified: Secondary | ICD-10-CM

## 2023-02-25 NOTE — Progress Notes (Signed)
   Established Patient Office Visit  Subjective   Patient ID: [name], female    DOB: February 17, 1976  Age: 47 y.o. MRN: [id number]  Chief Complaint  Patient presents with   Skin Problem    Left shoulder possible cyst     HPI  Skin lesion on her post left shoulder. Not painful or bothersome.  Her husband actually noticed that he said it had been there for "a while" she is not really sure what timeframe.  She states he did try to squeeze on it but nothing came out of it and so encouraged her to follow-up.  Remember experiencing any type of tick bite or bug bite.    ROS    Objective:     BP 124/78   Pulse 78   Ht 5' (1.524 m)   Wt 255 lb (115.7 kg)   SpO2 99%   BMI 49.80 kg/m    Physical Exam Skin:    Comments: Lesion on left posterior shoulder with a 1 to 2 mm black core and coming off of it it has the appearance of a vesicle.      No results found for any visits on 02/25/23.    The 10-year ASCVD risk score (Arnett DK, et al., 2019) is: 1.1%    Assessment & Plan:   Problem List Items Addressed This Visit   None Visit Diagnoses     Skin lesion    -  Primary   Relevant Orders   Ambulatory referral to Dermatology   Wound culture      Skin lesion -interestingly the lesion has what looks most like a black core most like a blackhead but then coming out from that area it looks like a small blister.  Cleaned the skin with alcohol and lanced it to get a bacterial culture.  Will send culture off and will call with results once available.  She may end up needing to see dermatology to have the epidermal cyst removed completely.  Unusual.  I am not sure if it is infected epidermal cyst or if it was some type of bug bite or something else it is just not clear.  No follow-ups on file.    Nani Gasser, MD

## 2023-03-01 NOTE — Progress Notes (Signed)
Call patient: Wound culture came back negative working a go ahead and move forward with the dermatology referral to have the spot removed.

## 2023-03-02 LAB — WOUND CULTURE: Organism ID, Bacteria: NONE SEEN

## 2023-03-13 ENCOUNTER — Other Ambulatory Visit: Payer: Self-pay

## 2023-03-13 ENCOUNTER — Ambulatory Visit
Admission: EM | Admit: 2023-03-13 | Discharge: 2023-03-13 | Disposition: A | Discharge status: Home / Self Care | Payer: BC Managed Care – PPO | Attending: Family Medicine | Admitting: Family Medicine

## 2023-03-13 ENCOUNTER — Encounter: Payer: Self-pay | Admitting: Emergency Medicine

## 2023-03-13 DIAGNOSIS — H9201 Otalgia, right ear: Secondary | ICD-10-CM

## 2023-03-13 DIAGNOSIS — H6691 Otitis media, unspecified, right ear: Secondary | ICD-10-CM | POA: Diagnosis not present

## 2023-03-13 MED ORDER — AMOXICILLIN-POT CLAVULANATE 875-125 MG PO TABS: 1.0000 | ORAL_TABLET | Freq: Two times a day (BID) | ORAL | 0 refills | Status: AC

## 2023-03-13 NOTE — Discharge Instructions (Addendum)
 Advised patient to take medication as directed with food to completion.  Encouraged to increase daily water intake to 64 ounces per day while taking this medication.  Advised if symptoms worsen and/or unresolved please follow-up with PCP or here for further evaluation.

## 2023-03-13 NOTE — ED Provider Notes (Signed)
[facility]    CSN: [id number] Arrival date & time: 03/13/23  1043      History   Chief Complaint Chief Complaint  Patient presents with   Ear Pain    HPI [name] is a 47 y.o. female.   HPI 47 year old female presents with right ear pain and pressure that began yesterday.  PMH significant for morbid obesity, chronic pain of left knee, IBS, and HTN.  Past Medical History:  Diagnosis Date   Hypertension    IBS (irritable bowel syndrome)    PCOS (polycystic ovarian syndrome)     Patient Active Problem List   Diagnosis Date Noted   Chronic pain of left knee 10/22/2022   Abnormal CT of the abdomen 12/08/2021   Snoring 12/22/2019   IFG (impaired fasting glucose) 10/01/2017   Encounter for weight management 04/06/2016   Family history of pancreatic cancer 12/05/2015   Essential hypertension 06/24/2015   S/P cesarean section 02/02/2011   POLYCYSTIC OVARIAN DISEASE 03/26/2008   Hyperlipidemia 03/26/2008   COMMON MIGRAINE 03/26/2008   IBS 03/26/2008    Past Surgical History:  Procedure Laterality Date   CESAREAN SECTION  2008   CESAREAN SECTION  02/02/2011   Procedure: CESAREAN SECTION;  Surgeon: Jeani Hawking, MD;  Location: [facility];  Service: Gynecology;  Laterality: N/A;   ESSURE TUBAL LIGATION  03/26/11    OB History     Gravida  4   Para  3   Term  3   Preterm      AB      Living  3      SAB      IAB      Ectopic      Multiple      Live Births  1            Home Medications    Prior to Admission medications   Medication Sig Start Date End Date Taking? Authorizing Provider  amoxicillin-clavulanate (AUGMENTIN) 875-125 MG tablet Take 1 tablet by mouth 2 (two) times daily for 10 days. 03/13/23 03/23/23 Yes Trevor Iha, FNP  amLODipine (NORVASC) 10 MG tablet TAKE 1 TABLET BY MOUTH EVERY DAY 11/05/22   Agapito Games, MD  meloxicam (MOBIC) 15 MG tablet TAKE 1 TABLET BY MOUTH EVERY 24 HOURS WITH A MEAL FOR 2  WEEKS, THEN ONCE DAILY AS NEEDED FOR PAIN 02/10/23   Monica Becton, MD  metoprolol succinate (TOPROL-XL) 100 MG 24 hr tablet TAKE 1 TABLET BY MOUTH EVERY DAY WITH OR IMMEDIATELY FOLLOWING A MEAL 12/16/22   Agapito Games, MD  SODIUM FLUORIDE 5000 PPM 1.1 % PSTE Take by mouth. 04/27/20   [provider]  spironolactone (ALDACTONE) 25 MG tablet TAKE 1 TABLET BY MOUTH EVERY DAY 01/27/23   Agapito Games, MD    Family History Family History  Problem Relation Age of Onset   Diabetes Mother    Hypertension Mother    Hyperlipidemia Mother    Pancreatic cancer Father    Heart attack Paternal Grandmother    Cancer Paternal Grandfather        Kidney and liver   Hypertension Other     Social History Social History   Tobacco Use   Smoking status: Never   Smokeless tobacco: Never  Vaping Use   Vaping status: Never Used  Substance Use Topics   Alcohol use: No   Drug use: No     Allergies   Patient has no known allergies.  Review of Systems Review of Systems  HENT:  Positive for ear pain.   All other systems reviewed and are negative.    Physical Exam Triage Vital Signs ED Triage Vitals  Encounter Vitals Group     BP 03/13/23 1109 121/79     Systolic BP Percentile --      Diastolic BP Percentile --      Pulse Rate 03/13/23 1109 74     Resp --      Temp 03/13/23 1109 98.5 F (36.9 C)     Temp Source 03/13/23 1109 Oral     SpO2 03/13/23 1109 98 %     Weight --      Height --      Head Circumference --      Peak Flow --      Pain Score 03/13/23 1107 2     Pain Loc --      Pain Education --      Exclude from Growth Chart --    No data found.  Updated Vital Signs BP 121/79 (BP Location: Left Arm) Comment (BP Location): large cuff  Pulse 74   Temp 98.5 F (36.9 C) (Oral)   LMP 03/01/2023   SpO2 98%    Physical Exam Vitals and nursing note reviewed.  Constitutional:      Appearance: Normal appearance. She is obese. She is not  ill-appearing.  HENT:     Head: Normocephalic and atraumatic.     Right Ear: External ear normal.     Left Ear: External ear normal.     Ears:     Comments: Left TM: Clear, retracted with good light reflex and mobility; right TM: Red round, retracted, decreased mobility irritability of right EAC noted; mild eustachian tube dysfunction noted bilaterally    Mouth/Throat:     Mouth: Mucous membranes are moist.     Pharynx: Oropharynx is clear.  Eyes:     Extraocular Movements: Extraocular movements intact.     Conjunctiva/sclera: Conjunctivae normal.     Pupils: Pupils are equal, round, and reactive to light.  Cardiovascular:     Rate and Rhythm: Normal rate and regular rhythm.     Pulses: Normal pulses.     Heart sounds: Normal heart sounds.  Musculoskeletal:        General: Normal range of motion.     Cervical back: Normal range of motion and neck supple.  Skin:    General: Skin is warm and dry.  Neurological:     General: No focal deficit present.     Mental Status: She is alert and oriented to person, place, and time. Mental status is at baseline.  Psychiatric:        Mood and Affect: Mood normal.        Behavior: Behavior normal.      UC Treatments / Results  Labs (all labs ordered are listed, but only abnormal results are displayed) Labs Reviewed - No data to display  EKG   Radiology No results found.  Procedures Procedures (including critical care time)  Medications Ordered in UC Medications - No data to display  Initial Impression / Assessment and Plan / UC Course  I have reviewed the triage vital signs and the nursing notes.  Pertinent labs & imaging results that were available during my care of the patient were reviewed by me and considered in my medical decision making (see chart for details).     MDM: 1.  Acute right otitis media-Rx'd  Augmentin 875/125 mg tablet: Take 1 tablet twice daily x 10 days. 2.  Acute otalgia, right-Rx'd Augmentin 875/125 mg  tablet: Take 1 tablet twice daily x 10 days. Advised patient to take medication as directed with food to completion.  Encouraged to increase daily water intake to 64 ounces per day while taking this medication.  Advised if symptoms worsen and/or unresolved please follow-up with PCP or here for further evaluation.  Patient discharged home, hemodynamically stable. Final Clinical Impressions(s) / UC Diagnoses   Final diagnoses:  Acute otalgia, right  Acute right otitis media     Discharge Instructions      Advised patient to take medication as directed with food to completion.  Encouraged to increase daily water intake to 64 ounces per day while taking this medication.  Advised if symptoms worsen and/or unresolved please follow-up with PCP or here for further evaluation.     ED Prescriptions     Medication Sig Dispense Auth. Provider   amoxicillin-clavulanate (AUGMENTIN) 875-125 MG tablet Take 1 tablet by mouth 2 (two) times daily for 10 days. 20 tablet Trevor Iha, FNP      PDMP not reviewed this encounter.   Trevor Iha, FNP 03/13/23 1140

## 2023-03-13 NOTE — ED Triage Notes (Signed)
Right ear pain and pressure started yesterday. Denies cough, cold or runny nose symptoms.    Denies using any ear drops or taking medication for symptoms

## 2023-05-01 ENCOUNTER — Other Ambulatory Visit: Payer: Self-pay | Admitting: Family Medicine

## 2023-05-21 ENCOUNTER — Other Ambulatory Visit: Payer: Self-pay | Admitting: Family Medicine

## 2023-05-21 DIAGNOSIS — Z1231 Encounter for screening mammogram for malignant neoplasm of breast: Secondary | ICD-10-CM

## 2023-06-02 ENCOUNTER — Ambulatory Visit: Payer: 59

## 2023-06-02 DIAGNOSIS — Z1231 Encounter for screening mammogram for malignant neoplasm of breast: Secondary | ICD-10-CM | POA: Diagnosis not present

## 2023-06-03 ENCOUNTER — Other Ambulatory Visit: Payer: Self-pay | Admitting: Sports Medicine

## 2023-06-03 DIAGNOSIS — G8929 Other chronic pain: Secondary | ICD-10-CM

## 2023-06-03 NOTE — Progress Notes (Signed)
 Please call patient. Normal mammogram.  Repeat in 1 year.

## 2023-06-13 ENCOUNTER — Other Ambulatory Visit: Payer: Self-pay | Admitting: Family Medicine

## 2023-06-13 DIAGNOSIS — I10 Essential (primary) hypertension: Secondary | ICD-10-CM

## 2023-06-15 ENCOUNTER — Ambulatory Visit (INDEPENDENT_AMBULATORY_CARE_PROVIDER_SITE_OTHER): Payer: BC Managed Care – PPO | Admitting: Family Medicine

## 2023-06-15 ENCOUNTER — Encounter: Payer: Self-pay | Admitting: Family Medicine

## 2023-06-15 VITALS — BP 154/94 | HR 78 | Ht 60.0 in | Wt 252.0 lb

## 2023-06-15 DIAGNOSIS — Z0000 Encounter for general adult medical examination without abnormal findings: Secondary | ICD-10-CM | POA: Diagnosis not present

## 2023-06-15 DIAGNOSIS — Z7689 Persons encountering health services in other specified circumstances: Secondary | ICD-10-CM | POA: Diagnosis not present

## 2023-06-15 MED ORDER — PHENTERMINE HCL 15 MG PO CAPS: 15.0000 mg | ORAL_CAPSULE | ORAL | 1 refills | Status: DC

## 2023-06-15 MED ORDER — TOPIRAMATE 25 MG PO TABS: 25.0000 mg | ORAL_TABLET | Freq: Every day | ORAL | 1 refills | Status: DC

## 2023-06-15 NOTE — Progress Notes (Signed)
Complete physical exam  Patient: [name]   DOB: 01-25-1976   48 y.o. Female  MRN: [id number]  Subjective:    Chief Complaint  Patient presents with   Annual Exam         [name] is a 48 y.o. female who presents today for a complete physical exam. She reports consuming a general diet. The patient does not participate in regular exercise at present. She generally feels well. She reports sleeping fair, occ taking an OTC sleep aid. She does have additional problems to discuss today.    Most recent fall risk assessment:    12/21/2022    7:19 AM  Fall Risk   Falls in the past year? 1  Number falls in past yr: 0  Injury with Fall? 0  Risk for fall due to : No Fall Risks  Follow up Falls evaluation completed     Most recent depression screenings:    12/21/2022    7:19 AM 12/08/2021    8:45 AM  PHQ 2/9 Scores  PHQ - 2 Score 0 0         Patient Care Team: Agapito Games, MD as PCP - General   Outpatient Medications Prior to Visit  Medication Sig   amLODipine (NORVASC) 10 MG tablet TAKE 1 TABLET BY MOUTH EVERY DAY   meloxicam (MOBIC) 15 MG tablet TAKE 1 TABLET BY MOUTH EVERY 24 HOURS WITH A MEAL FOR 2 WEEKS, THEN ONCE DAILY AS NEEDED FOR PAIN   metoprolol succinate (TOPROL-XL) 100 MG 24 hr tablet TAKE 1 TABLET BY MOUTH EVERY DAY WITH OR IMMEDIATELY FOLLOWING A MEAL   SODIUM FLUORIDE 5000 PPM 1.1 % PSTE Take by mouth.   spironolactone (ALDACTONE) 25 MG tablet TAKE 1 TABLET BY MOUTH EVERY DAY   No facility-administered medications prior to visit.    ROS        Objective:     BP (!) 154/94   Pulse 78   Ht 5' (1.524 m)   Wt 252 lb (114.3 kg)   LMP 05/24/2023   SpO2 99%   BMI 49.22 kg/m     Physical Exam Vitals and nursing note reviewed.  Constitutional:      Appearance: Normal appearance.  HENT:     Head: Normocephalic and atraumatic.  Eyes:     Conjunctiva/sclera: Conjunctivae normal.  Cardiovascular:     Rate and Rhythm: Normal  rate and regular rhythm.  Pulmonary:     Effort: Pulmonary effort is normal.     Breath sounds: Normal breath sounds.  Skin:    General: Skin is warm and dry.  Neurological:     Mental Status: She is alert.  Psychiatric:        Mood and Affect: Mood normal.      No results found for any visits on 06/15/23.      Assessment & Plan:    Routine Health Maintenance and Physical Exam  Immunization History  Administered Date(s) Administered   Influenza Inj Mdck Quad Pf 03/11/2019   Influenza Split 02/23/2012   Influenza Whole 03/26/2009, 02/22/2013   Influenza, Seasonal, Injecte, Preservative Fre 02/19/2023   Influenza,inj,Quad PF,6+ Mos 04/13/2018, 01/23/2022   Influenza-Unspecified 02/27/2014, 02/25/2015, 03/14/2016, 03/11/2019, 02/24/2020, 02/22/2021   Moderna Covid-19 Fall Seasonal Vaccine 106yrs & older 03/05/2023   Moderna SARS-COV2 Booster Vaccination 03/25/2021   Moderna Sars-Covid-2 Vaccination 07/27/2019, 08/24/2019, 03/25/2020, 01/23/2022   Td 03/26/2008   Tdap 04/13/2018    Health Maintenance  Topic Date Due  Cervical Cancer Screening (HPV/Pap Cotest)  06/18/2025   DTaP/Tdap/Td (3 - Td or Tdap) 04/13/2028   Colonoscopy  12/10/2031   INFLUENZA VACCINE  Completed   COVID-19 Vaccine  Completed   Hepatitis C Screening  Completed   HIV Screening  Completed   HPV VACCINES  Aged Out    Discussed health benefits of physical activity, and encouraged her to engage in regular exercise appropriate for her age and condition.  Problem List Items Addressed This Visit       Other   Encounter for weight management   He had previously discussed the option of weight loss medications with her current BMI of 49.  She did do some price checking with her insurance company and for now her best option is going to be to do low-dose topiramate with low-dose phentermine combination.  Visit #: 1 Starting Weight: 252 lbs, BMI 49  Current weight: 252 lbs  Previous weight: Change in  weight: Goal weight: Dietary goals: cut back on sweet tea, and pick 1 more nutritional goal to work on over the next 4 to 6 weeks. Exercise goals: pick weekly exercise goal.  Medication: start topamax 25mg  every day and phentermine 15mg  QAM.   Follow-up and referrals:       Relevant Medications   topiramate (TOPAMAX) 25 MG tablet   phentermine 15 MG capsule   Other Visit Diagnoses       Wellness examination    -  Primary   Relevant Orders   CMP14+EGFR   Lipid panel   CBC      Return in about 6 weeks (around 07/27/2023) for weight management .     Shari Gasser, MD

## 2023-06-15 NOTE — Assessment & Plan Note (Addendum)
He had previously discussed the option of weight loss medications with her current BMI of 49.  She did do some price checking with her insurance company and for now her best option is going to be to do low-dose topiramate with low-dose phentermine combination.  Visit #: 1 Starting Weight: 252 lbs, BMI 49  Current weight: 252 lbs  Previous weight: Change in weight: Goal weight: Dietary goals: cut back on sweet tea, and pick 1 more nutritional goal to work on over the next 4 to 6 weeks. Exercise goals: pick weekly exercise goal.  Medication: start topamax 25mg  every day and phentermine 15mg  QAM.   Follow-up and referrals:

## 2023-06-16 ENCOUNTER — Encounter: Payer: Self-pay | Admitting: Family Medicine

## 2023-06-16 LAB — CBC
Hematocrit: 48.8 % — ABNORMAL HIGH (ref 34.0–46.6)
Hemoglobin: 15.9 g/dL (ref 11.1–15.9)
MCH: 29.2 pg (ref 26.6–33.0)
MCHC: 32.6 g/dL (ref 31.5–35.7)
MCV: 90 fL (ref 79–97)
Platelets: 284 10*3/uL (ref 150–450)
RBC: 5.44 x10E6/uL — ABNORMAL HIGH (ref 3.77–5.28)
RDW: 13.1 % (ref 11.7–15.4)
WBC: 7.2 10*3/uL (ref 3.4–10.8)

## 2023-06-16 LAB — CMP14+EGFR
ALT: 48 [IU]/L — ABNORMAL HIGH (ref 0–32)
AST: 25 [IU]/L (ref 0–40)
Albumin: 4.6 g/dL (ref 3.9–4.9)
Alkaline Phosphatase: 85 [IU]/L (ref 44–121)
BUN/Creatinine Ratio: 19 (ref 9–23)
BUN: 14 mg/dL (ref 6–24)
Bilirubin Total: 0.5 mg/dL (ref 0.0–1.2)
CO2: 21 mmol/L (ref 20–29)
Calcium: 10.1 mg/dL (ref 8.7–10.2)
Chloride: 99 mmol/L (ref 96–106)
Creatinine, Ser: 0.74 mg/dL (ref 0.57–1.00)
Globulin, Total: 2.8 g/dL (ref 1.5–4.5)
Glucose: 99 mg/dL (ref 70–99)
Potassium: 4.2 mmol/L (ref 3.5–5.2)
Sodium: 138 mmol/L (ref 134–144)
Total Protein: 7.4 g/dL (ref 6.0–8.5)
eGFR: 101 mL/min/{1.73_m2} (ref >59)

## 2023-06-16 LAB — LIPID PANEL
Chol/HDL Ratio: 4 {ratio} (ref 0.0–4.4)
Cholesterol, Total: 223 mg/dL — ABNORMAL HIGH (ref 100–199)
HDL: 56 mg/dL (ref >39)
LDL Chol Calc (NIH): 137 mg/dL — ABNORMAL HIGH (ref 0–99)
Triglycerides: 168 mg/dL — ABNORMAL HIGH (ref 0–149)
VLDL Cholesterol Cal: 30 mg/dL (ref 5–40)

## 2023-06-16 NOTE — Progress Notes (Signed)
Hi [name], your ALT liver enzyme is mildly elevated.  I do and keep an eye on that and plan to recheck again in 3 to 4 weeks.  Avoid any excess Tylenol or alcohol products.  Total cholesterol triglycerides and LDL are elevated continue to work on healthy diet and regular exercise to reduce those numbers.  No anemia.

## 2023-06-22 ENCOUNTER — Other Ambulatory Visit: Payer: Self-pay

## 2023-06-22 DIAGNOSIS — Z7689 Persons encountering health services in other specified circumstances: Secondary | ICD-10-CM

## 2023-06-22 MED ORDER — PHENTERMINE HCL 15 MG PO CAPS: 15.0000 mg | ORAL_CAPSULE | ORAL | 1 refills | Status: DC

## 2023-06-22 NOTE — Telephone Encounter (Signed)
Copied from CRM [id number]. Topic: Clinical - Prescription Issue >> Jun 21, 2023  2:31 PM Nila Nephew wrote: Reason for CRM: Patient calling to request a PA be sent in for phentermine 15 MG capsule.

## 2023-06-22 NOTE — Telephone Encounter (Signed)
We can request a PA but it may or may not be approved and the cash price on phentermine 15 mg is around $15 at places like [facility].  Would she rather just go there and pay cash?

## 2023-06-22 NOTE — Telephone Encounter (Signed)
Patient requesting to move script to [facility] pharmacy.  Pended prescription.

## 2023-07-09 ENCOUNTER — Other Ambulatory Visit: Payer: Self-pay | Admitting: Family Medicine

## 2023-07-09 DIAGNOSIS — Z7689 Persons encountering health services in other specified circumstances: Secondary | ICD-10-CM

## 2023-07-16 ENCOUNTER — Other Ambulatory Visit: Payer: Self-pay | Admitting: *Deleted

## 2023-07-16 DIAGNOSIS — R748 Abnormal levels of other serum enzymes: Secondary | ICD-10-CM

## 2023-07-17 LAB — CMP14+EGFR
ALT: 26 [IU]/L (ref 0–32)
AST: 18 [IU]/L (ref 0–40)
Albumin: 4.1 g/dL (ref 3.9–4.9)
Alkaline Phosphatase: 87 [IU]/L (ref 44–121)
BUN/Creatinine Ratio: 14 (ref 9–23)
BUN: 11 mg/dL (ref 6–24)
Bilirubin Total: 0.4 mg/dL (ref 0.0–1.2)
CO2: 21 mmol/L (ref 20–29)
Calcium: 9.8 mg/dL (ref 8.7–10.2)
Chloride: 102 mmol/L (ref 96–106)
Creatinine, Ser: 0.76 mg/dL (ref 0.57–1.00)
Globulin, Total: 2.9 g/dL (ref 1.5–4.5)
Glucose: 89 mg/dL (ref 70–99)
Potassium: 4.3 mmol/L (ref 3.5–5.2)
Sodium: 140 mmol/L (ref 134–144)
Total Protein: 7 g/dL (ref 6.0–8.5)
eGFR: 98 mL/min/{1.73_m2} (ref >59)

## 2023-07-18 ENCOUNTER — Encounter: Payer: Self-pay | Admitting: Family Medicine

## 2023-07-18 NOTE — Progress Notes (Signed)
 Your lab work is within acceptable range and there are no concerning findings.   ?

## 2023-07-21 ENCOUNTER — Other Ambulatory Visit: Payer: Self-pay | Admitting: Family Medicine

## 2023-07-21 DIAGNOSIS — I10 Essential (primary) hypertension: Secondary | ICD-10-CM

## 2023-07-29 ENCOUNTER — Ambulatory Visit (INDEPENDENT_AMBULATORY_CARE_PROVIDER_SITE_OTHER): Payer: 59 | Admitting: Family Medicine

## 2023-07-29 VITALS — BP 129/83 | HR 73 | Ht 60.0 in | Wt 254.0 lb

## 2023-07-29 DIAGNOSIS — Z713 Dietary counseling and surveillance: Secondary | ICD-10-CM

## 2023-07-29 DIAGNOSIS — Z7689 Persons encountering health services in other specified circumstances: Secondary | ICD-10-CM

## 2023-07-29 NOTE — Assessment & Plan Note (Addendum)
 He had previously discussed the option of weight loss medications with her current BMI of 49.  She did do some price checking with her insurance company and for now her best option is going to be to do low-dose topiramate with low-dose phentermine combination.   Visit #: 2 Starting Weight: 252 lbs, BMI 49   Current weight: 254 lbs  Previous weight: 252 lbs  Change in weight: Up 2 lbs  Goal weight: Dietary goals: cut back on sweet tea, and pick 1 more nutritional goal to work on over the next 4 to 6 weeks. Exercise goals: pick weekly exercise goal.  Medication: Continue  topamax 25mg  every day and phentermine 15mg  QAM.   Follow-up and referrals: 6 weeks.   Discussed that if she is not seeing things move in the right direction in the next 2 to 3 weeks then we can consider bumping up the topiramate.

## 2023-07-29 NOTE — Progress Notes (Signed)
 Patient doing well on appetite suppressant. No SOB,palpitations,or insomnia

## 2023-07-29 NOTE — Progress Notes (Signed)
   Established Patient Office Visit  Subjective  Patient ID: [name], female    DOB: [birth year]  Age: 48 y.o. MRN: [id number]  Chief Complaint  Patient presents with   Weight Check    HPI  She is here for follow-up for weight management.  She has been on her low-dose Topamax and low-dose phentermine for about 5 weeks.  She has noticed a decrease in her appetite she feels like she is eating smaller amounts she stopped drinking soda because it just does not taste right to her anymore and she has cut back on her sweet tea she was sick with a respiratory infection for about 2-1/2-week so was not able to exercise much but is trying to get back on track with that but her knee gives her hard time sometimes it makes it hard to be consistent.  Her frustration is that she just has not seen the scale move even though she does feel like she is eating less and she has noticed improvement in her appetite.  Denies  any chest pain palpitations or insomnia on the phentermine.    ROS    Objective:     BP 129/83   Pulse 73   Ht 5' (1.524 m)   Wt 254 lb (115.2 kg)   SpO2 100%   BMI 49.61 kg/m    Physical Exam Vitals and nursing note reviewed.  Constitutional:      Appearance: Normal appearance.  HENT:     Head: Normocephalic and atraumatic.  Eyes:     Conjunctiva/sclera: Conjunctivae normal.  Cardiovascular:     Rate and Rhythm: Normal rate and regular rhythm.  Pulmonary:     Effort: Pulmonary effort is normal.     Breath sounds: Normal breath sounds.  Skin:    General: Skin is warm and dry.  Neurological:     Mental Status: She is alert.  Psychiatric:        Mood and Affect: Mood normal.      No results found for any visits on 07/29/23.    The 10-year ASCVD risk score (Arnett DK, et al., 2019) is: 1.4%    Assessment & Plan:   Problem List Items Addressed This Visit       Other   Encounter for weight management - Primary   He had previously discussed the option  of weight loss medications with her current BMI of 49.  She did do some price checking with her insurance company and for now her best option is going to be to do low-dose topiramate with low-dose phentermine combination.   Visit #: 2 Starting Weight: 252 lbs, BMI 49   Current weight: 254 lbs  Previous weight: 252 lbs  Change in weight: Up 2 lbs  Goal weight: Dietary goals: cut back on sweet tea, and pick 1 more nutritional goal to work on over the next 4 to 6 weeks. Exercise goals: pick weekly exercise goal.  Medication: Continue  topamax 25mg  every day and phentermine 15mg  QAM.   Follow-up and referrals: 6 weeks.   Discussed that if she is not seeing things move in the right direction in the next 2 to 3 weeks then we can consider bumping up the topiramate.        Relevant Orders   TSH    Return in about 6 weeks (around 09/09/2023) for weight check.    Nani Gasser, MD

## 2023-07-31 LAB — TSH: TSH: 3.01 u[IU]/mL (ref 0.450–4.500)

## 2023-08-03 ENCOUNTER — Encounter: Payer: Self-pay | Admitting: Physician Assistant

## 2023-08-03 NOTE — Progress Notes (Signed)
 Shellie,   Your TSH is in normal range but has increased from last check. I would like to add free T4 and Free T3 to get a better picture of how your thyroid is producing.   Dr. Judie Petit is out of town this week.   My name is Tandy Gaw PA-C

## 2023-08-15 LAB — SPECIMEN STATUS REPORT

## 2023-08-15 LAB — T3, FREE, DIALYSIS, LC/MS-MS: T3, Free, Dialysis, LC/MS-MS: 3.6 pg/mL

## 2023-08-15 LAB — FREE T4 BY DIALYSIS/MASS SPEC: Free T4 by Dialysis: 1.2 ng/dL

## 2023-08-16 ENCOUNTER — Telehealth: Payer: Self-pay | Admitting: Family Medicine

## 2023-08-16 NOTE — Progress Notes (Signed)
 T3 and T4 look good and are adequate.  Lets just plan to recheck your TSH in about 6 months did not since it did shift just slightly from your usual baseline even though it is technically still in the normal range.

## 2023-08-16 NOTE — Telephone Encounter (Signed)
 Copied from CRM [id number]. Topic: Appointments - Scheduling Inquiry for Clinic >> Aug 16, 2023  3:23 PM Nila Nephew wrote: Reason for CRM: Patient is calling to schedule an appointment for a weight check with dr. Linford Arnold. She is out April 17, 18, and 21st. She states provider wanted her to come back around that time. She states she can also make the latest appointment on any given day (except April 14th). As opposed to wait list, patient would like to see if she can be worked in with provider permission. Please advise and call patient back.   Please leave voicemail if calling this afternoon.

## 2023-08-17 NOTE — Telephone Encounter (Signed)
 LVM advising pt that she can schedule either the week before or after

## 2023-08-23 ENCOUNTER — Other Ambulatory Visit (HOSPITAL_COMMUNITY): Payer: Self-pay

## 2023-08-23 ENCOUNTER — Encounter: Payer: Self-pay | Admitting: Family Medicine

## 2023-08-23 ENCOUNTER — Ambulatory Visit: Admitting: Family Medicine

## 2023-08-23 ENCOUNTER — Telehealth: Payer: Self-pay

## 2023-08-23 DIAGNOSIS — Z7689 Persons encountering health services in other specified circumstances: Secondary | ICD-10-CM

## 2023-08-23 DIAGNOSIS — Z713 Dietary counseling and surveillance: Secondary | ICD-10-CM | POA: Diagnosis not present

## 2023-08-23 MED ORDER — TOPIRAMATE 25 MG PO TABS
25.0000 mg | ORAL_TABLET | Freq: Every day | ORAL | 1 refills | Status: DC
Start: 2023-08-23 — End: 2024-02-21

## 2023-08-23 MED ORDER — PHENTERMINE HCL 15 MG PO CAPS: 15.0000 mg | ORAL_CAPSULE | ORAL | 1 refills | Status: DC

## 2023-08-23 NOTE — Telephone Encounter (Signed)
 Pharmacy Patient Advocate Encounter   Received notification from Onbase that prior authorization for Phentermine HCl 15MG  capsules is required/requested.   Insurance verification completed.   The patient is insured through [facility] .   Per test claim: PA required; PA submitted to above mentioned insurance via CoverMyMeds Key/confirmation #/EOC [facility] Status is pending

## 2023-08-23 NOTE — Progress Notes (Signed)
   Established Patient Office Visit  Subjective  Patient ID: [name], female    DOB: [birth year]  Age: 48 y.o. MRN: [id number]  Chief Complaint  Patient presents with   Weight Check    HPI  F/U Wt mgt -Oertli doing well with combination of Topamax and phentermine she feels like it is finally doing really well.  She is happy with the appetite suppression and she still trying to do intermittent fasting.  Doesn't really get hungry until 3PM> More recently started doing her stretching bands every night and plans on starting to do a little bit more walking.  She has a cruise coming up this summer and says that she will likely hold the phentermine while she is on the trip.    ROS    Objective:     BP 123/78   Pulse 67   Ht 5' (1.524 m)   Wt 247 lb (112 kg)   SpO2 100%   BMI 48.24 kg/m    Physical Exam Vitals and nursing note reviewed.  Constitutional:      Appearance: Normal appearance.  HENT:     Head: Normocephalic and atraumatic.  Eyes:     Conjunctiva/sclera: Conjunctivae normal.  Cardiovascular:     Rate and Rhythm: Normal rate and regular rhythm.  Pulmonary:     Effort: Pulmonary effort is normal.     Breath sounds: Normal breath sounds.  Skin:    General: Skin is warm and dry.  Neurological:     Mental Status: She is alert.  Psychiatric:        Mood and Affect: Mood normal.      No results found for any visits on 08/23/23.    The 10-year ASCVD risk score (Arnett DK, et al., 2019) is: 1.3%    Assessment & Plan:   Problem List Items Addressed This Visit       Other   Encounter for weight management    Visit #: 3 Starting Weight: 252 lbs, BMI 49 Current weight: 247 lbs  Previous weight: 254 lbs  Change in weight: down 7  lbs  Goal weight:  Dietary goals: cut back on sweet tea, and pick 1 more nutritional goal to work on over the next 4 to 6 weeks. Exercise goals: Stretches 7 nights per week planning on starting walking  again. Medication: Continue topamax 25mg  every day and phentermine 15mg  QAM.   Follow-up and referrals: 6 weeks.    Discussed that if she is not seeing things move in the right direction in the next 2 to 3 weeks then we can consider bumping up the topiramate.        Relevant Medications   phentermine 15 MG capsule   topiramate (TOPAMAX) 25 MG tablet    No follow-ups on file.    Nani Gasser, MD

## 2023-08-23 NOTE — Assessment & Plan Note (Addendum)
  Visit #: 3 Starting Weight: 252 lbs, BMI 49 Current weight: 247 lbs  Previous weight: 254 lbs  Change in weight: down 7  lbs  Goal weight:  Dietary goals: cut back on sweet tea, and pick 1 more nutritional goal to work on over the next 4 to 6 weeks. Exercise goals: Stretches 7 nights per week planning on starting walking again. Medication: Continue topamax 25mg  every day and phentermine 15mg  QAM.   Follow-up and referrals: 6 weeks.    Discussed that if she is not seeing things move in the right direction in the next 2 to 3 weeks then we can consider bumping up the topiramate.

## 2023-08-23 NOTE — Telephone Encounter (Signed)
 Pharmacy Patient Advocate Encounter  Received notification from [facility] that Prior Authorization for Phentermine HCL 15 caps has been APPROVED from 08/23/23 to 11/21/23. Unable to obtain price due to refill too soon rejection, last fill date 08/23/23 next available fill date4/24/25   PA #/Case ID/Reference #: [id number]

## 2023-09-22 ENCOUNTER — Ambulatory Visit: Admitting: Family Medicine

## 2023-10-04 ENCOUNTER — Encounter: Payer: Self-pay | Admitting: Family Medicine

## 2023-10-04 ENCOUNTER — Ambulatory Visit (INDEPENDENT_AMBULATORY_CARE_PROVIDER_SITE_OTHER): Admitting: Family Medicine

## 2023-10-04 VITALS — BP 122/80 | HR 81 | Ht 60.0 in | Wt 245.0 lb

## 2023-10-04 DIAGNOSIS — R21 Rash and other nonspecific skin eruption: Secondary | ICD-10-CM

## 2023-10-04 DIAGNOSIS — I10 Essential (primary) hypertension: Secondary | ICD-10-CM | POA: Diagnosis not present

## 2023-10-04 DIAGNOSIS — R7301 Impaired fasting glucose: Secondary | ICD-10-CM

## 2023-10-04 DIAGNOSIS — Z7689 Persons encountering health services in other specified circumstances: Secondary | ICD-10-CM | POA: Diagnosis not present

## 2023-10-04 DIAGNOSIS — F4321 Adjustment disorder with depressed mood: Secondary | ICD-10-CM | POA: Diagnosis not present

## 2023-10-04 MED ORDER — PHENTERMINE HCL 37.5 MG PO CAPS
37.5000 mg | ORAL_CAPSULE | ORAL | 1 refills | Status: DC
Start: 2023-10-04 — End: 2023-12-14

## 2023-10-04 NOTE — Progress Notes (Signed)
 Established Patient Office Visit  Subjective  Patient ID: [name], female    DOB: 11-17-1975  Age: 48 y.o. MRN: [id number]  Chief Complaint  Patient presents with   Medical Management of Chronic Issues    Encounter for weight management     HPI 6 week f/u weight mgt : She feels like she is just really struggling she is down a couple more pounds but feels like it has been incredibly [place] and has gotten a little frustrated she says she is increased her activity level and is trying to do better on her diet.  She is down about 2 pounds.  Tolerating the phentermine  and Topamax  well.  May is a really difficult month. 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  In fact this year was the 30th anniversary and is just very traumatic around this time a year it was also his birthday month. 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  She does have good support with her family and her co-workers.   She also says she has had a rash on her left hand that is itchy that started this winter.  It has not really gone away.    ROS    Objective:     BP 122/80   Pulse 81   Ht 5' (1.524 m)   Wt 245 lb (111.1 kg)   SpO2 99%   BMI 47.85 kg/m    Physical Exam Vitals and nursing note reviewed.  Constitutional:      Appearance: Normal appearance.  HENT:     Head: Normocephalic and atraumatic.  Eyes:     Conjunctiva/sclera: Conjunctivae normal.  Cardiovascular:     Rate and Rhythm: Normal rate and regular rhythm.  Pulmonary:     Effort: Pulmonary effort is normal.     Breath sounds: Normal breath sounds.  Skin:    General: Skin is warm and dry.  Neurological:     Mental Status: She is alert.  Psychiatric:        Mood and Affect: Mood normal.      No results found for any visits on 10/04/23.    The 10-year ASCVD risk score (Arnett DK, et al., 2019) is: 1.2%    Assessment & Plan:    Problem List Items Addressed This Visit       Cardiovascular and Mediastinum   Essential hypertension   Pete blood pressure looked much better with a manual.        Endocrine   IFG (impaired fasting glucose) - Primary   Plan to recheck this summer        Other   Grief   She really is doing well and has good family support.  Is just always an emotional month.  Just encouraged her to do a lot of self-care and let me know if she needs any help.      Encounter for weight management   Track BPs at home.  If ok this week we can increase dose on the phentermine  Try to use MyFitness Pal or Lose it to track calories.   Visit #: 4 Starting Weight: 252 lbs, BMI 49  Current weight:  245 lbs  Previous weight: 247 lbs  Change in weight from last OV: down 2  lbs  Goal weight:  Dietary goals: has worked on diet and avoiding soda and sweet tea.  Eat protein first with each meal.  Exercise goals: Stretches 7 nights per week planning on starting walking again. Medication: Continue topamax  25mg  every day and inc phentermine  to 30 mg QAM.   Follow-up and referrals: 6 weeks.    Discussed that if she is not seeing things move in the right direction in the next 2 to 3 weeks then we can consider bumping up the topiramate .      Relevant Medications   phentermine  37.5 MG capsule   Other Visit Diagnoses       Rash of hand       Relevant Orders   Fungus Stain       Rash -Did skin scraping today to evaluate for any fungal elements.if neg will tx with topical steroid    Return in about 6 weeks (around 11/15/2023) for weight management .    Duaine German, MD

## 2023-10-04 NOTE — Assessment & Plan Note (Addendum)
 Track BPs at home.  If ok this week we can increase dose on the phentermine  Try to use MyFitness Pal or Lose it to track calories.   Visit #: 4 Starting Weight: 252 lbs, BMI 49  Current weight:  245 lbs  Previous weight: 247 lbs  Change in weight from last OV: down 2  lbs  Goal weight:  Dietary goals: has worked on diet and avoiding soda and sweet tea.  Eat protein first with each meal.  Exercise goals: Stretches 7 nights per week planning on starting walking again. Medication: Continue topamax  25mg  every day and inc phentermine  to 30 mg QAM.   Follow-up and referrals: 6 weeks.    Discussed that if she is not seeing things move in the right direction in the next 2 to 3 weeks then we can consider bumping up the topiramate .

## 2023-10-04 NOTE — Assessment & Plan Note (Signed)
 Plan to recheck this summer

## 2023-10-04 NOTE — Patient Instructions (Addendum)
 Track BPs at home.  If ok this week we can increase dose on the phentermine  Try to use MyFitness Pal or Lose it to track calories.

## 2023-10-04 NOTE — Assessment & Plan Note (Signed)
 [name] blood pressure looked much better with a manual.

## 2023-10-04 NOTE — Assessment & Plan Note (Signed)
 She really is doing well and has good family support.  Is just always an emotional month.  Just encouraged her to do a lot of self-care and let me know if she needs any help.

## 2023-10-07 ENCOUNTER — Ambulatory Visit: Payer: Self-pay | Admitting: Family Medicine

## 2023-10-07 LAB — FUNGUS STAIN

## 2023-10-07 MED ORDER — TRIAMCINOLONE ACETONIDE 0.5 % EX OINT: 1.0000 | TOPICAL_OINTMENT | Freq: Two times a day (BID) | CUTANEOUS | 0 refills | Status: AC | PRN

## 2023-10-07 NOTE — Progress Notes (Signed)
 Good news, scraping was negative for any type of skin fungus some going to send over a topical cream to put on to help with the rash and itching.  Just put a thin layer and then use a good dye free perfume free moisturizer on top also try to continue to moisturize your hands multiple times a day if possible.

## 2023-11-07 ENCOUNTER — Other Ambulatory Visit: Payer: Self-pay | Admitting: Family Medicine

## 2023-11-22 ENCOUNTER — Ambulatory Visit (INDEPENDENT_AMBULATORY_CARE_PROVIDER_SITE_OTHER): Admitting: Family Medicine

## 2023-11-22 VITALS — BP 124/84 | HR 71 | Ht 60.0 in | Wt 242.1 lb

## 2023-11-22 DIAGNOSIS — Z713 Dietary counseling and surveillance: Secondary | ICD-10-CM | POA: Diagnosis not present

## 2023-11-22 DIAGNOSIS — Z7689 Persons encountering health services in other specified circumstances: Secondary | ICD-10-CM

## 2023-11-22 NOTE — Progress Notes (Signed)
   Established Patient Office Visit  Subjective  Patient ID: [name], female    DOB: [birth year]  Age: 48 y.o. MRN: [id number]  Chief Complaint  Patient presents with   Weight Check    HPI  F/U Wt Mgt - 6 week f/u. She is doing well. Just got back from a cruise. Has been walking some but has had knee problems so that has been limiting. She noticed on her cruis ehs really had a hard time walking on the beach and really wants to get her stamina better.  She is currently on phentermine  37.5 mg and Topamax  25 mg twice a day.  She denies any side effects she feels like she has been tolerating it well and does feel like it is helpful.  Blood pressure has looked great the last couple times she has been here even on the medication.    ROS    Objective:     BP 124/84   Pulse 71   Ht 5' (1.524 m)   Wt 242 lb 1.6 oz (109.8 kg)   SpO2 99%   BMI 47.28 kg/m    Physical Exam Vitals and nursing note reviewed.  Constitutional:      Appearance: Normal appearance.  HENT:     Head: Normocephalic and atraumatic.   Eyes:     Conjunctiva/sclera: Conjunctivae normal.    Cardiovascular:     Rate and Rhythm: Normal rate and regular rhythm.  Pulmonary:     Effort: Pulmonary effort is normal.     Breath sounds: Normal breath sounds.   Skin:    General: Skin is warm and dry.   Neurological:     Mental Status: She is alert.   Psychiatric:        Mood and Affect: Mood normal.      No results found for any visits on 11/22/23.    The 10-year ASCVD risk score (Arnett DK, et al., 2019) is: 1.3%    Assessment & Plan:   Problem List Items Addressed This Visit       Other   Encounter for weight management - Primary   Visit #: 4 Starting Weight: 252 lbs, BMI 49   Current weight:  242 lbs  Previous weight: 245 lbs  Change in weight from last OV: down 3 lbs  Goal weight:  Dietary goals: has worked on diet and avoiding soda and sweet tea.  Eat protein first with each meal.   She says her next goal is to work on cutting back on her carb intake. Exercise goals: Start walking again on a flat surface.  We discussed maybe doing a local indoor track or maybe even at [facility] if she needs more air conditioning since it is in the heat of the summer or getting out early in the morning.  But not overdoing it with that knee but starting to work on some strengthening.. Medication: Continue topamax  25mg  every day and inc phentermine  to 37.5 mg QAM.  Reports feeling well on the medications. Follow-up and referrals: 6 weeks.       Return in about 7 weeks (around 01/10/2024) for weight mgt.    Dorothyann Byars, MD

## 2023-11-22 NOTE — Assessment & Plan Note (Addendum)
 Visit #: 4 Starting Weight: 252 lbs, BMI 49   Current weight:  242 lbs  Previous weight: 245 lbs  Change in weight from last OV: down 3 lbs  Goal weight:  Dietary goals: has worked on diet and avoiding soda and sweet tea.  Eat protein first with each meal.  She says her next goal is to work on cutting back on her carb intake. Exercise goals: Start walking again on a flat surface.  We discussed maybe doing a local indoor track or maybe even at [facility] if she needs more air conditioning since it is in the heat of the summer or getting out early in the morning.  But not overdoing it with that knee but starting to work on some strengthening.. Medication: Continue topamax  25mg  every day and inc phentermine  to 37.5 mg QAM.  Reports feeling well on the medications. Follow-up and referrals: 6 weeks.

## 2023-12-08 ENCOUNTER — Other Ambulatory Visit: Payer: Self-pay | Admitting: Family Medicine

## 2023-12-08 DIAGNOSIS — I10 Essential (primary) hypertension: Secondary | ICD-10-CM

## 2023-12-12 ENCOUNTER — Other Ambulatory Visit: Payer: Self-pay | Admitting: Family Medicine

## 2023-12-12 DIAGNOSIS — Z7689 Persons encountering health services in other specified circumstances: Secondary | ICD-10-CM

## 2024-01-10 ENCOUNTER — Ambulatory Visit: Admitting: Family Medicine

## 2024-01-10 ENCOUNTER — Encounter: Payer: Self-pay | Admitting: Family Medicine

## 2024-01-10 VITALS — BP 126/68 | HR 81 | Ht 60.0 in | Wt 242.6 lb

## 2024-01-10 DIAGNOSIS — Z7689 Persons encountering health services in other specified circumstances: Secondary | ICD-10-CM

## 2024-01-10 DIAGNOSIS — Z713 Dietary counseling and surveillance: Secondary | ICD-10-CM

## 2024-01-10 NOTE — Progress Notes (Signed)
   Established Patient Office Visit  Subjective  Patient ID: [name], female    DOB: [birth year]  Age: 48 y.o. MRN: [id number]  Chief Complaint  Patient presents with   Weight Check     HPI  For follow-up for weight management-she did go on vacation at the beginning of August.  She did tolerate the increased dose on the phentermine  to 37.5 so she is done well with that.  Since coming back home from vacation she has lost about 2 pounds though she does feel like she is plateauing a little bit.  She would like to give it another 6 weeks to continue to work on some healthy changes.  Trying to cooperate in some activity and exercise.    ROS    Objective:     BP 126/68   Pulse 81   Ht 5' (1.524 m)   Wt 242 lb 9.6 oz (110 kg)   SpO2 98%   BMI 47.38 kg/m    Physical Exam Vitals and nursing note reviewed.  Constitutional:      Appearance: Normal appearance.  HENT:     Head: Normocephalic and atraumatic.  Eyes:     Conjunctiva/sclera: Conjunctivae normal.  Cardiovascular:     Rate and Rhythm: Normal rate and regular rhythm.  Pulmonary:     Effort: Pulmonary effort is normal.     Breath sounds: Normal breath sounds.  Skin:    General: Skin is warm and dry.  Neurological:     Mental Status: She is alert.  Psychiatric:        Mood and Affect: Mood normal.      No results found for any visits on 01/10/24.    The 10-year ASCVD risk score (Arnett DK, et al., 2019) is: 1.4%    Assessment & Plan:   Problem List Items Addressed This Visit       Other   Encounter for weight management - Primary   Visit #: 5 Starting Weight: 252 lbs, BMI 49   Current weight:  242 lbs  Previous weight: 242 lbs  Change in weight from last OV: same weight.  Goal weight:  Dietary goals: Eat protein first with each meal.  She says her next goal is to work on cutting back on her carb intake. Exercise goals: Start walking again on a flat surface.  We discussed maybe doing a local  indoor track or maybe even at [facility] if she needs more air conditioning since it is in the heat of the summer or getting out early in the morning.  But not overdoing it with that knee but starting to work on some strengthening.. Medication: Continue topamax  25mg  every day and phentermine  to 37.5 mg QAM.  Reports feeling well on the medications. Follow-up and referrals: 6 weeks       Return in about 8 weeks (around 03/06/2024).    Dorothyann Byars, MD

## 2024-01-10 NOTE — Assessment & Plan Note (Addendum)
 Visit #: 5 Starting Weight: 252 lbs, BMI 49   Current weight:  242 lbs  Previous weight: 242 lbs  Change in weight from last OV: same weight.  Goal weight:  Dietary goals: Eat protein first with each meal.  She says her next goal is to work on cutting back on her carb intake. Exercise goals: Start walking again on a flat surface.  We discussed maybe doing a local indoor track or maybe even at [facility] if she needs more air conditioning since it is in the heat of the summer or getting out early in the morning.  But not overdoing it with that knee but starting to work on some strengthening.. Medication: Continue topamax  25mg  every day and phentermine  to 37.5 mg QAM.  Reports feeling well on the medications. Follow-up and referrals: 6 weeks

## 2024-01-25 ENCOUNTER — Encounter: Payer: Self-pay | Admitting: Sports Medicine

## 2024-02-14 ENCOUNTER — Other Ambulatory Visit: Payer: Self-pay | Admitting: Family Medicine

## 2024-02-14 DIAGNOSIS — Z7689 Persons encountering health services in other specified circumstances: Secondary | ICD-10-CM

## 2024-02-21 ENCOUNTER — Encounter: Payer: Self-pay | Admitting: Family Medicine

## 2024-02-21 ENCOUNTER — Ambulatory Visit: Admitting: Family Medicine

## 2024-02-21 VITALS — BP 130/77 | HR 80 | Ht 60.0 in | Wt 242.0 lb

## 2024-02-21 DIAGNOSIS — R7301 Impaired fasting glucose: Secondary | ICD-10-CM | POA: Diagnosis not present

## 2024-02-21 DIAGNOSIS — Z6841 Body Mass Index (BMI) 40.0 and over, adult: Secondary | ICD-10-CM

## 2024-02-21 DIAGNOSIS — Z23 Encounter for immunization: Secondary | ICD-10-CM | POA: Diagnosis not present

## 2024-02-21 DIAGNOSIS — E66813 Obesity, class 3: Secondary | ICD-10-CM

## 2024-02-21 DIAGNOSIS — N951 Menopausal and female climacteric states: Secondary | ICD-10-CM

## 2024-02-21 DIAGNOSIS — E661 Drug-induced obesity: Secondary | ICD-10-CM

## 2024-02-21 DIAGNOSIS — Z7689 Persons encountering health services in other specified circumstances: Secondary | ICD-10-CM

## 2024-02-21 DIAGNOSIS — I10 Essential (primary) hypertension: Secondary | ICD-10-CM | POA: Diagnosis not present

## 2024-02-21 MED ORDER — TOPIRAMATE 25 MG PO TABS: 25.0000 mg | ORAL_TABLET | Freq: Two times a day (BID) | ORAL | 0 refills | Status: DC

## 2024-02-21 MED ORDER — PHENTERMINE HCL 37.5 MG PO CAPS: ORAL_CAPSULE | ORAL | 1 refills | Status: DC

## 2024-02-21 NOTE — Assessment & Plan Note (Addendum)
 Visit #: 6 Starting Weight: 252 lbs, BMI 49   Current weight:  242 lbs  Previous weight: 242 lbs  Change in weight from last OV: same weight.  Goal weight:  Dietary goals: Eat protein first with each meal.  She says her next goal is to work on cutting back on her carb intake. Exercise goals: Keep up walking regularly at work. Occ takes NSAID for knee pain.  Medication: Continue topamax  25mg  every day and phentermine  to 37.5 mg QAM.  Reports feeling well on the medications. Follow-up and referrals: 8 weeks

## 2024-02-21 NOTE — Progress Notes (Signed)
 Established Patient Office Visit  Subjective  Patient ID: [name], female    DOB: 02/07/1976  Age: 48 y.o. MRN: [id number]  Chief Complaint  Patient presents with   Weight Check    HPI   Discussed the use of AI scribe software for clinical note transcription with the patient, who gave verbal consent to proceed.  History of Present Illness [name] is a 48 year old female who presents for weight management and stress-related concerns.  Psychological stress - Significant stress ongoing for several weeks, described as a 'nuclear bomb' in her life - Recent stressors include family issues.   Weight fluctuation and management - Previous weight loss to 239 pounds, current weight 241 pounds - Weight fluctuation attributed to stress and recent celebrations - Maintains weight through increased walking  Sleep disturbance and perimenopausal symptoms - Uses half a dose of Z-Quil to aid sleep, particularly due to perimenopausal symptoms - Without Z-Quil, sleep duration is limited to four to five hours - Significant reduction in caffeine intake, which has improved sleep quality and reduced hot flashes  Pain management - Uses meloxicam  as needed for pain - Has surplus of meloxicam  from previous prescriptions - Auto-refill option at pharmacy turned off to better manage medication supply   {History (Optional):23778}  ROS    Objective:     BP 130/77   Pulse 80   Ht 5' (1.524 m)   Wt 242 lb (109.8 kg)   SpO2 98%   BMI 47.26 kg/m  {Vitals History (Optional):23777}  Physical Exam Vitals and nursing note reviewed.  Constitutional:      Appearance: Normal appearance.  HENT:     Head: Normocephalic and atraumatic.  Eyes:     Conjunctiva/sclera: Conjunctivae normal.  Cardiovascular:     Rate and Rhythm: Normal rate and regular rhythm.  Pulmonary:     Effort: Pulmonary effort is normal.     Breath sounds: Normal breath sounds.  Skin:    General: Skin is warm and  dry.  Neurological:     Mental Status: She is alert.  Psychiatric:        Mood and Affect: Mood normal.      No results found for any visits on 02/21/24.  {Labs (Optional):23779}  The 10-year ASCVD risk score (Arnett DK, et al., 2019) is: 1.5%    Assessment & Plan:   Problem List Items Addressed This Visit       Cardiovascular and Mediastinum   Essential hypertension   Relevant Orders   CMP14+EGFR   Hemoglobin A1c     Endocrine   IFG (impaired fasting glucose)   Relevant Orders   CMP14+EGFR   Hemoglobin A1c     Other   Encounter for weight management - Primary   Visit #: 6 Starting Weight: 252 lbs, BMI 49   Current weight:  242 lbs  Previous weight: 242 lbs  Change in weight from last OV: same weight.  Goal weight:  Dietary goals: Eat protein first with each meal.  She says her next goal is to work on cutting back on her carb intake. Exercise goals: Keep up walking regularly at work. Occ takes NSAID for knee pain.  Medication: Continue topamax  25mg  every day and phentermine  to 37.5 mg QAM.  Reports feeling well on the medications. Follow-up and referrals: 8 weeks      Relevant Medications   phentermine  37.5 MG capsule   topiramate  (TOPAMAX ) 25 MG tablet   Other Visit Diagnoses  Encounter for immunization       Relevant Orders   Pfizer Comirnaty Covid-19 Vaccine 69yrs & older (Completed)      Assessment and Plan Assessment & Plan Obesity Stress impacted weight management. Weight stable at 240 lbs with increased walking. Phentermine  37.5 mg QAM used for weight control. - Refilled Phentermine  prescription. - Scheduled follow-up in 8 weeks to assess progress and plan for holiday season.  Perimenopausal symptoms Perimenopausal symptoms affecting sleep. Half dose of Z-Quil effective. Reduced caffeine improved sleep and hot flashes. - Ordered blood work to monitor kidney and liver function, and sodium levels.  Chronic pain - knees Chronic pain  managed with meloxicam  as needed. Walking beneficial for pain and stress management.   Return in about 2 months (around 04/22/2024) for weight mgt .    Dorothyann Byars, MD

## 2024-02-22 ENCOUNTER — Ambulatory Visit: Payer: Self-pay | Admitting: Family Medicine

## 2024-02-22 LAB — CMP14+EGFR
ALT: 20 IU/L (ref 0–32)
AST: 16 IU/L (ref 0–40)
Albumin: 4.4 g/dL (ref 3.9–4.9)
Alkaline Phosphatase: 74 IU/L (ref 41–116)
BUN/Creatinine Ratio: 15 (ref 9–23)
BUN: 12 mg/dL (ref 6–24)
Bilirubin Total: 0.4 mg/dL (ref 0.0–1.2)
CO2: 18 mmol/L — ABNORMAL LOW (ref 20–29)
Calcium: 9.6 mg/dL (ref 8.7–10.2)
Chloride: 103 mmol/L (ref 96–106)
Creatinine, Ser: 0.78 mg/dL (ref 0.57–1.00)
Globulin, Total: 2.5 g/dL (ref 1.5–4.5)
Glucose: 108 mg/dL — ABNORMAL HIGH (ref 70–99)
Potassium: 4.3 mmol/L (ref 3.5–5.2)
Sodium: 143 mmol/L (ref 134–144)
Total Protein: 6.9 g/dL (ref 6.0–8.5)
eGFR: 94 mL/min/1.73 (ref >59)

## 2024-02-22 LAB — HEMOGLOBIN A1C
Est. average glucose Bld gHb Est-mCnc: 120 mg/dL
Hgb A1c MFr Bld: 5.8 % — ABNORMAL HIGH (ref 4.8–5.6)

## 2024-02-22 NOTE — Progress Notes (Signed)
 Hi [name], our liver and kidney function looks good. A1C at 5.8, up just a little from last time.  We will monitor.

## 2024-02-22 NOTE — Assessment & Plan Note (Signed)
 BP at goal

## 2024-04-10 ENCOUNTER — Other Ambulatory Visit: Payer: Self-pay | Admitting: Medical Genetics

## 2024-04-11 ENCOUNTER — Other Ambulatory Visit: Payer: Self-pay | Admitting: Family Medicine

## 2024-04-11 DIAGNOSIS — I10 Essential (primary) hypertension: Secondary | ICD-10-CM

## 2024-04-17 ENCOUNTER — Ambulatory Visit: Admitting: Family Medicine

## 2024-04-17 VITALS — BP 128/82 | HR 84 | Ht 60.0 in | Wt 241.5 lb

## 2024-04-17 DIAGNOSIS — Z713 Dietary counseling and surveillance: Secondary | ICD-10-CM | POA: Diagnosis not present

## 2024-04-17 DIAGNOSIS — Z7689 Persons encountering health services in other specified circumstances: Secondary | ICD-10-CM

## 2024-04-17 MED ORDER — PHENTERMINE HCL 37.5 MG PO CAPS: ORAL_CAPSULE | ORAL | 1 refills | Status: AC

## 2024-04-17 NOTE — Assessment & Plan Note (Signed)
 Visit #: 7 Starting Weight: 252 lbs, BMI 49   Current weight:  241 lbs  Previous weight: 242 lbs  Change in weight from last OV: same weight.  Goal weight:  Dietary goals: Eat protein first with each meal.  She says her next goal is to work on cutting back on her carb intake. Exercise goals: Keep up walking regularly at work. Occ takes NSAID for knee pain.  Medication: Continue topamax  25mg  every day and phentermine  to 37.5 mg QAM.  Reports feeling well on the medications. Follow-up and referrals: 8 weeks

## 2024-04-17 NOTE — Progress Notes (Signed)
 Established Patient Office Visit  Patient ID: [name], female    DOB: November 21, 1975  Age: 48 y.o. MRN: [id number] PCP: Alvan Dorothyann BIRCH, MD  Chief Complaint  Patient presents with   Weight Check    Subjective:     HPI  Discussed the use of AI scribe software for clinical note transcription with the patient, who gave verbal consent to proceed.  History of Present Illness [name] is a 48 year old female who presents with stress management and weight concerns.  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, leading to feelings of being overwhelmed - Recent organization of a large family event for her son's [employer], attended by more people than expected, contributing to elevated stress - Emotional stress related to honoring her late father and his best friend during the event  Sleep disturbance - Reduced sleep quality and quantity attributed to a busy schedule and prolonged work hours exceeding ten hours per day - No negative impact on sleep from current medication  Weight management - Previous weight loss with weight dropping below 240 pounds - Recent setback in weight management due to poor eating habits during a period of increased stress - Current weight beginning to decrease again as she resumes healthier eating habits - Active focus on portion control and meal planning - Current medication effectively controlling appetite and preventing overeating despite stress - Making healthier food choices, including smaller portions and balanced meals  Cardiopulmonary symptoms - No chest pain - No palpitations     ROS    Objective:     BP 128/82   Pulse 84   Ht 5' (1.524 m)   Wt 241 lb 8 oz (109.5 kg)   SpO2 100%   BMI 47.16 kg/m    Physical Exam Vitals and  nursing note reviewed.  Constitutional:      Appearance: Normal appearance.  HENT:     Head: Normocephalic and atraumatic.  Eyes:     Conjunctiva/sclera: Conjunctivae normal.  Cardiovascular:     Rate and Rhythm: Normal rate and regular rhythm.  Pulmonary:     Effort: Pulmonary effort is normal.     Breath sounds: Normal breath sounds.  Skin:    General: Skin is warm and dry.  Neurological:     Mental Status: She is alert.  Psychiatric:        Mood and Affect: Mood normal.      No results found for any visits on 04/17/24.    The 10-year ASCVD risk score (Arnett DK, et al., 2019) is: 1.5%    Assessment & Plan:   Problem List Items Addressed This Visit       Other   Encounter for weight management - Primary   Visit #: 7 Starting Weight: 252 lbs, BMI 49   Current weight:  241 lbs  Previous weight: 242 lbs  Change in weight from last OV: same weight.  Goal weight:  Dietary goals: Eat protein first with each meal.  She says her next goal is to work on cutting back on her carb intake. Exercise goals: Keep up walking regularly at work. Occ takes NSAID for knee pain.  Medication: Continue topamax  25mg  every day and phentermine  to 37.5 mg QAM.  Reports feeling well on the medications. Follow-up and referrals: 8 weeks  Relevant Medications   phentermine  37.5 MG capsule    Assessment and Plan Assessment & Plan Obesity Recent weight gain due to stress and irregular eating. Structured meal plan and portion control showing improvement. Medication aiding appetite control. - Continue meal planning and portion control. - Use meal planning app for grocery shopping and preparation. - Maintain regular physical activity: power walks, weight exercises. - Annual follow-up in January.  Hypertension Well-managed with medication. Stress management and dietary changes improving health. - Continue antihypertensive regimen. - Ensure medication refills.   Return in about  2 months (around 06/17/2024).    Dorothyann Byars, MD [facility] at [facility]

## 2024-04-25 ENCOUNTER — Ambulatory Visit: Admitting: Family Medicine

## 2024-04-25 ENCOUNTER — Encounter: Payer: Self-pay | Admitting: Family Medicine

## 2024-04-25 VITALS — BP 130/80 | HR 69 | Temp 97.7°F | Ht 60.0 in | Wt 240.0 lb

## 2024-04-25 DIAGNOSIS — H6121 Impacted cerumen, right ear: Secondary | ICD-10-CM

## 2024-04-25 DIAGNOSIS — H938X2 Other specified disorders of left ear: Secondary | ICD-10-CM

## 2024-04-25 DIAGNOSIS — H9201 Otalgia, right ear: Secondary | ICD-10-CM | POA: Diagnosis not present

## 2024-04-25 DIAGNOSIS — H9192 Unspecified hearing loss, left ear: Secondary | ICD-10-CM

## 2024-04-25 MED ORDER — PREDNISONE 20 MG PO TABS: 40.0000 mg | ORAL_TABLET | Freq: Every day | ORAL | 0 refills | Status: DC

## 2024-04-25 MED ORDER — TRAMADOL HCL 50 MG PO TABS: 50.0000 mg | ORAL_TABLET | Freq: Three times a day (TID) | ORAL | 0 refills | Status: AC | PRN

## 2024-04-25 NOTE — Progress Notes (Signed)
 Acute Office Visit  Patient ID: [name], female    DOB: May 25, 1976, 48 y.o.   MRN: [id number]  PCP: Alvan Dorothyann BIRCH, MD  Chief Complaint  Patient presents with   Ear Pain    Subjective:     HPI  Discussed the use of AI scribe software for clinical note transcription with the patient, who gave verbal consent to proceed.  History of Present Illness [name] is a 48 year old female who presents with bilateral ear pain and pressure.  Otalgia and aural pressure - Right  ear pain and pressure, onset last Wednesday around midnight - Initial severe pain and pressure in the right ear, prompting urgent care visit by 3 AM - Right ear initially more affected, with sharp pains and less pressure today; left ear now feels completely closed with significant discomfort - Episodes of ear popping provide temporary relief of pressure - No significant improvement despite nearing completion of amoxicillin  course and regular use of Sudafed (one to two tablets every twelve hours) - Pain and pressure severe enough to consider emergency room visit last night - No fever  Sleep disturbance - Severely impaired sleep due to ear pain and pressure, limited to two to three hours of rest at a time - Pain and pressure frequently wake her from sleep  Self-care measures and medication response - Received ibuprofen  injection for pain at urgent care - Started augmentin  course for suspected ear infection; two doses remaining, no significant relief - Regular use of Sudafed throughout the week, without relief - Attempted ear cleaning for suspected wax buildup, no improvement - Tried nasal spray a few times but finds it uncomfortable due to sensitive nose  Nasal sensitivity - History of sensitive nose attributed to broken nose at birth - Averse to nasal sprays due to discomfort   ROS     Objective:    BP 130/80   Pulse 69   Temp 97.7 F (36.5 C) (Oral)   Ht 5' (1.524 m)   Wt 240 lb  (108.9 kg)   SpO2 99%   BMI 46.87 kg/m    Physical Exam Vitals reviewed.  Constitutional:      Appearance: Normal appearance.  HENT:     Head: Normocephalic.     Ears:     Comments: Right TM is dull and partialy blocked by cerumen.  Left TM is erythematous in the corner.      Nose: Nose normal.  Eyes:     Conjunctiva/sclera: Conjunctivae normal.  Pulmonary:     Effort: Pulmonary effort is normal.  Neurological:     Mental Status: She is alert and oriented to person, place, and time.  Psychiatric:        Mood and Affect: Mood normal.        Behavior: Behavior normal.       No results found for any visits on 04/25/24.     Assessment & Plan:   Problem List Items Addressed This Visit   None Visit Diagnoses       Impacted cerumen of right ear    -  Primary     Right ear pain         Hearing loss of left ear, unspecified hearing loss type           Assessment and Plan Assessment & Plan Right ear pain,  Initially right ear pain and pressure, now left ear involvement. Right ear dull, opaque eardrum with wax obstruction. Left ear redness  indicating inflammation. Amoxicillin  ineffective, suggesting viral etiology or resistance. Differential includes Haemophilus influenzae or viral etiology. - Prescribed prednisone  for inflammation and pain relief. - Prescribed tramadol for pain management, especially at bedtime. - Performed tympanometry to assess ear pressure. - Provided saline nasal spray samples for nasal congestion relief.  Impacted cerumen, right ear Impacted cerumen in the right ear obstructing eardrum view, potentially contributing to symptoms. Wax removal attempted by patient with limited success. - Attempted ear irrigation to remove impacted cerumen from the right ear.   Tympanometry normal in both ears.  See scanned doc   Meds ordered this encounter  Medications   predniSONE  (DELTASONE ) 20 MG tablet    Sig: Take 2 tablets (40 mg total) by mouth daily  with breakfast.    Dispense:  10 tablet    Refill:  0   traMADol (ULTRAM) 50 MG tablet    Sig: Take 1 tablet (50 mg total) by mouth every 8 (eight) hours as needed for up to 5 days.    Dispense:  15 tablet    Refill:  0    Indication: Cerumen impaction of the ear(s) Medical necessity statement: On physical examination, cerumen impairs clinically significant portions of the external auditory canal, and tympanic membrane. Noted obstructive, copious cerumen that cannot be removed without magnification  Consent: Discussed benefits and risks of procedure and verbal consent obtained Procedure: Patient was prepped for the procedure. Utilized an otoscope to assess and take note of the ear canal, the tympanic membrane, and the presence, amount, and placement of the cerumen. Gentle water irrigation and soft plastic curette was utilized to remove cerumen.  Post procedure examination: shows cerumen was completely removed. Patient tolerated procedure well. The patient is made aware that they may experience temporary vertigo, temporary hearing loss, and temporary discomfort. If these symptom last for more than 24 hours to call the clinic or proceed to the ED.    Return if symptoms worsen or fail to improve.  Dorothyann Byars, MD [facility] at [facility]

## 2024-04-25 NOTE — Progress Notes (Signed)
 Pt was seen at [facility] on 11/24

## 2024-05-21 ENCOUNTER — Other Ambulatory Visit: Payer: Self-pay | Admitting: Family Medicine

## 2024-05-30 ENCOUNTER — Other Ambulatory Visit: Payer: Self-pay | Admitting: Family Medicine

## 2024-05-30 DIAGNOSIS — Z1231 Encounter for screening mammogram for malignant neoplasm of breast: Secondary | ICD-10-CM

## 2024-06-09 ENCOUNTER — Other Ambulatory Visit: Payer: Self-pay | Admitting: Family Medicine

## 2024-06-09 DIAGNOSIS — I10 Essential (primary) hypertension: Secondary | ICD-10-CM

## 2024-06-14 ENCOUNTER — Other Ambulatory Visit: Payer: Self-pay | Admitting: Family Medicine

## 2024-06-14 DIAGNOSIS — Z7689 Persons encountering health services in other specified circumstances: Secondary | ICD-10-CM

## 2024-06-15 ENCOUNTER — Ambulatory Visit (INDEPENDENT_AMBULATORY_CARE_PROVIDER_SITE_OTHER): Admitting: Family Medicine

## 2024-06-15 ENCOUNTER — Encounter: Payer: Self-pay | Admitting: Family Medicine

## 2024-06-15 VITALS — BP 135/69 | HR 72 | Ht 60.0 in | Wt 241.0 lb

## 2024-06-15 DIAGNOSIS — Z0000 Encounter for general adult medical examination without abnormal findings: Secondary | ICD-10-CM

## 2024-06-15 DIAGNOSIS — Z23 Encounter for immunization: Secondary | ICD-10-CM

## 2024-06-15 NOTE — Progress Notes (Signed)
 "  Complete physical exam  Patient: [name]    DOB: [birth year] 49 y.o.   MRN: [id number]  Chief Complaint  Patient presents with   Annual Exam    Subjective:    [name] is a 49 y.o. female who presents today for a complete physical exam. She reports consuming a general diet. Stay active  She generally feels well. She reports sleeping fairly well. She does not have additional problems to discuss today.   Discussed the use of AI scribe software for clinical note transcription with the patient, who gave verbal consent to proceed.  History of Present Illness [name] is a 49 year old female who presents for a hepatitis B vaccination and follow-up on her health maintenance.  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 - Uncertain hepatitis B vaccination history; vaccine was not routine during childhood. - History of chickenpox at 76 old; aware of need for future shingles vaccination.  Glycemic control and weight management - A1c was borderline at 5.7 during last blood work in the fall. - Monitors weight; experienced slight increase over holidays, now returned to baseline. - Currently taking phentermine  at 6 AM to avoid sleep disturbances; no issues with sleep reported.  Musculoskeletal symptoms - Knee pain has significantly decreased; minimal pain over the past weeks.  Psychosocial stressors - Recently appointed as engineer, maintenance (it), resulting in reduced stress levels. - Increased involvement in care of family members experiencing health declines, including falls and chronic illness.   Most recent fall risk assessment:    06/15/2024    3:45 PM  Fall Risk   Falls in the past year? 1  Number falls in past yr: 0  Injury with Fall? 0  Risk for fall due to : No Fall Risks   Follow up Falls evaluation completed     Most recent depression screenings:    06/15/2024    3:45 PM 04/25/2024    3:05 PM  PHQ 2/9 Scores  PHQ - 2 Score 0 0  PHQ- 9 Score 0         Patient Care Team: Alvan Dorothyann BIRCH, MD as PCP - General   ROS    Objective:    BP 135/69   Pulse 72   Ht 5' (1.524 m)   Wt 241 lb (109.3 kg)   SpO2 99%   BMI 47.07 kg/m     Physical Exam Constitutional:      Appearance: Normal appearance.  HENT:     Head: Normocephalic and atraumatic.     Right Ear: Tympanic membrane, ear canal and external ear normal.     Left Ear: Tympanic membrane, ear canal and external ear normal.     Nose: Nose normal.     Mouth/Throat:     Pharynx: Oropharynx is clear.  Eyes:     Extraocular Movements: Extraocular movements intact.     Conjunctiva/sclera: Conjunctivae normal.     Pupils: Pupils are equal, round, and reactive to light.  Neck:     Thyroid: No thyromegaly.  Cardiovascular:     Rate and Rhythm: Normal rate and regular rhythm.  Pulmonary:     Effort: Pulmonary effort is normal.     Breath sounds: Normal breath sounds.  Abdominal:     General: Bowel sounds are normal.     Palpations:  Abdomen is soft.     Tenderness: There is no abdominal tenderness.  Musculoskeletal:        General: No swelling.     Cervical back: Neck supple.  Skin:    General: Skin is warm and dry.  Neurological:     Mental Status: She is oriented to person, place, and time.  Psychiatric:        Mood and Affect: Mood normal.        Behavior: Behavior normal.       No results found for any visits on 06/15/24.      Assessment & Plan:    Routine Health Maintenance and Physical Exam Immunization History  Administered Date(s) Administered   Hep A / Hep B 06/15/2024   Influenza Inj Mdck Quad Pf 03/11/2019   Influenza Split 02/23/2012   Influenza Whole 03/26/2009, 02/22/2013   Influenza, Mdck, Trivalent,PF 6+ MOS(egg free) 02/14/2024   Influenza,  Seasonal, Injecte, Preservative Fre 02/19/2023   Influenza,inj,Quad PF,6+ Mos 04/13/2018, 01/23/2022   Influenza-Unspecified 02/27/2014, 02/25/2015, 03/14/2016, 03/11/2019, 02/24/2020, 02/22/2021   Moderna SARS-COV2 Booster Vaccination 03/25/2021   Moderna Sars-Covid-2 Vaccination 07/27/2019, 08/24/2019, 03/25/2020, 01/23/2022   Pfizer(Comirnaty)Fall Seasonal Vaccine 12 years and older 02/21/2024   Td 03/26/2008   Tdap 04/13/2018    Health Maintenance  Topic Date Due   Hepatitis B Vaccines 19-59 Average Risk (2 of 3 - 19+ 3-dose series) 07/13/2024   Mammogram  06/01/2025   Cervical Cancer Screening (HPV/Pap Cotest)  06/18/2025   DTaP/Tdap/Td (3 - Td or Tdap) 04/13/2028   Colonoscopy  12/10/2031   Influenza Vaccine  Completed   HPV VACCINES (No Doses Required) Completed   COVID-19 Vaccine  Completed   Hepatitis C Screening  Completed   HIV Screening  Completed   Pneumococcal Vaccine  Aged Out   Meningococcal B Vaccine  Aged Out    Discussed health benefits of physical activity, and encouraged her to engage in regular exercise appropriate for her age and condition.  Problem List Items Addressed This Visit   None Visit Diagnoses       Routine general medical examination at a health care facility    -  Primary   Relevant Orders   CBC   Lipid Panel With LDL/HDL Ratio   CMP14+EGFR   HgB A1c     Need for vaccination with Twinrix       Relevant Orders   Hepatitis A hepatitis B combined vaccine IM (Completed)     Morbid obesity (HCC)            Wellness visit-overall doing well.  Will get updated labs today.  First Twinrix injection given.  Plan for shingles at age 71.  Mammogram scheduled. Pap smear up-to-date.  Assessment and Plan Assessment & Plan Immunization update (Twinrix: hepatitis A and B) Hepatitis B vaccination status uncertain. Recommended Twinrix due to public exposure and travel preparedness. - Administered Twinrix vaccine today.  Prediabetes A1c was  5.7%. Monitoring needed to prevent diabetes progression. - Continue to monitor A1c levels regularly.  Obesity, morbid -BMI greater than 45 Weight management ongoing with some success. Phentermine  used without sleep issues.  Continue current plan and regimen with phentermine  and follow-up in about 8 weeks - Continue current weight management plan. - Ensure phentermine  is taken at 6 AM to avoid sleep disturbances. - Continue to work on the [employer] and trying to incorporate increased activity and exercise.    Return in about 8 weeks (around 08/10/2024) for Weight management .  Dorothyann Byars, MD [facility] Medicine at [facility]    "

## 2024-06-16 ENCOUNTER — Encounter: Payer: Self-pay | Admitting: Family Medicine

## 2024-06-16 ENCOUNTER — Ambulatory Visit: Payer: Self-pay | Admitting: Family Medicine

## 2024-06-16 DIAGNOSIS — D582 Other hemoglobinopathies: Secondary | ICD-10-CM

## 2024-06-16 LAB — CBC
Hematocrit: 48.1 % — ABNORMAL HIGH (ref 34.0–46.6)
Hemoglobin: 16.4 g/dL — ABNORMAL HIGH (ref 11.1–15.9)
MCH: 31.2 pg (ref 26.6–33.0)
MCHC: 34.1 g/dL (ref 31.5–35.7)
MCV: 92 fL (ref 79–97)
Platelets: 286 x10E3/uL (ref 150–450)
RBC: 5.25 x10E6/uL (ref 3.77–5.28)
RDW: 13.3 % (ref 11.7–15.4)
WBC: 7.9 x10E3/uL (ref 3.4–10.8)

## 2024-06-16 LAB — HEMOGLOBIN A1C
Est. average glucose Bld gHb Est-mCnc: 117 mg/dL
Hgb A1c MFr Bld: 5.7 % — ABNORMAL HIGH (ref 4.8–5.6)

## 2024-06-16 LAB — CMP14+EGFR
ALT: 23 IU/L (ref 0–32)
AST: 21 IU/L (ref 0–40)
Albumin: 4.6 g/dL (ref 3.9–4.9)
Alkaline Phosphatase: 72 IU/L (ref 41–116)
BUN/Creatinine Ratio: 17 (ref 9–23)
BUN: 15 mg/dL (ref 6–24)
Bilirubin Total: 0.6 mg/dL (ref 0.0–1.2)
CO2: 21 mmol/L (ref 20–29)
Calcium: 9.8 mg/dL (ref 8.7–10.2)
Chloride: 101 mmol/L (ref 96–106)
Creatinine, Ser: 0.87 mg/dL (ref 0.57–1.00)
Globulin, Total: 2.8 g/dL (ref 1.5–4.5)
Glucose: 85 mg/dL (ref 70–99)
Potassium: 4 mmol/L (ref 3.5–5.2)
Sodium: 138 mmol/L (ref 134–144)
Total Protein: 7.4 g/dL (ref 6.0–8.5)
eGFR: 83 mL/min/1.73

## 2024-06-16 LAB — LIPID PANEL WITH LDL/HDL RATIO
Cholesterol, Total: 209 mg/dL — ABNORMAL HIGH (ref 100–199)
HDL: 50 mg/dL
LDL Chol Calc (NIH): 131 mg/dL — ABNORMAL HIGH (ref 0–99)
LDL/HDL Ratio: 2.6 ratio (ref 0.0–3.2)
Triglycerides: 160 mg/dL — ABNORMAL HIGH (ref 0–149)
VLDL Cholesterol Cal: 28 mg/dL (ref 5–40)

## 2024-06-16 NOTE — Progress Notes (Signed)
 Hi [name], hemoglobin was up just slightly not quite sure why normally are around 15 which is fantastic but this time it was around 16.  I would like to recheck that in about 3 to 4 weeks.  Your total cholesterol and LDL are mildly elevated just encourage you to continue to work on healthy diet and regular exercise.  A1c 5.7 still in the prediabetes range.  Continue to work on cutting back on carbohydrates and processed foods.  Work on increasing vegetable intake.  Metabolic panel including liver and kidney function is normal.

## 2024-06-19 ENCOUNTER — Other Ambulatory Visit (HOSPITAL_COMMUNITY)

## 2024-06-19 ENCOUNTER — Encounter: Admitting: Family Medicine

## 2024-06-29 ENCOUNTER — Ambulatory Visit

## 2024-06-29 DIAGNOSIS — Z1231 Encounter for screening mammogram for malignant neoplasm of breast: Secondary | ICD-10-CM

## 2024-07-11 ENCOUNTER — Other Ambulatory Visit (HOSPITAL_COMMUNITY)

## 2024-08-14 ENCOUNTER — Ambulatory Visit: Admitting: Family Medicine
